# Patient Record
Sex: Male | Born: 1975 | Race: Black or African American | Hispanic: No | Marital: Single | Smoking: Current every day smoker
Health system: Southern US, Community
[De-identification: ages and names within clinical notes are randomized; demographics above are authoritative.]

## PROBLEM LIST (undated history)

## (undated) DIAGNOSIS — I10 Essential (primary) hypertension: Secondary | ICD-10-CM

---

## 2004-10-01 ENCOUNTER — Encounter (HOSPITAL_COMMUNITY): Admission: RE | Admit: 2004-10-01 | Discharge: 2004-10-31 | Payer: Self-pay | Admitting: Preventative Medicine

## 2007-01-01 ENCOUNTER — Emergency Department (HOSPITAL_COMMUNITY): Admission: EM | Admit: 2007-01-01 | Discharge: 2007-01-02 | Payer: Self-pay | Admitting: *Deleted

## 2007-09-11 ENCOUNTER — Emergency Department (HOSPITAL_COMMUNITY): Admission: EM | Admit: 2007-09-11 | Discharge: 2007-09-11 | Payer: Self-pay | Admitting: Emergency Medicine

## 2009-04-24 ENCOUNTER — Emergency Department (HOSPITAL_COMMUNITY): Admission: EM | Admit: 2009-04-24 | Discharge: 2009-04-25 | Payer: Self-pay | Admitting: Emergency Medicine

## 2009-05-18 ENCOUNTER — Emergency Department (HOSPITAL_COMMUNITY): Admission: EM | Admit: 2009-05-18 | Discharge: 2009-05-18 | Payer: Self-pay | Admitting: Emergency Medicine

## 2011-02-20 ENCOUNTER — Emergency Department (HOSPITAL_COMMUNITY)
Admission: EM | Admit: 2011-02-20 | Discharge: 2011-02-20 | Disposition: A | Discharge status: Home / Self Care | Payer: 59 | Attending: Emergency Medicine | Admitting: Emergency Medicine

## 2011-02-20 DIAGNOSIS — N489 Disorder of penis, unspecified: Secondary | ICD-10-CM | POA: Insufficient documentation

## 2011-02-20 DIAGNOSIS — I10 Essential (primary) hypertension: Secondary | ICD-10-CM | POA: Insufficient documentation

## 2011-02-20 LAB — URINALYSIS, ROUTINE W REFLEX MICROSCOPIC
Glucose, UA: NEGATIVE mg/dL
Hgb urine dipstick: NEGATIVE
Ketones, ur: NEGATIVE mg/dL
Protein, ur: NEGATIVE mg/dL
pH: 5.5 (ref 5.0–8.0)

## 2011-02-21 LAB — GC/CHLAMYDIA PROBE AMP, GENITAL: GC Probe Amp, Genital: NEGATIVE

## 2011-03-25 LAB — URINALYSIS, ROUTINE W REFLEX MICROSCOPIC
Glucose, UA: NEGATIVE mg/dL
Protein, ur: NEGATIVE mg/dL
Specific Gravity, Urine: 1.02 (ref 1.005–1.030)
Urobilinogen, UA: 0.2 mg/dL (ref 0.0–1.0)

## 2011-03-25 LAB — URINE MICROSCOPIC-ADD ON

## 2011-06-27 ENCOUNTER — Emergency Department (HOSPITAL_COMMUNITY)
Admission: EM | Admit: 2011-06-27 | Discharge: 2011-06-27 | Disposition: A | Discharge status: Home / Self Care | Payer: 59 | Attending: Emergency Medicine | Admitting: Emergency Medicine

## 2011-06-27 ENCOUNTER — Emergency Department (HOSPITAL_COMMUNITY): Payer: 59

## 2011-06-27 ENCOUNTER — Encounter: Payer: Self-pay | Admitting: *Deleted

## 2011-06-27 DIAGNOSIS — W19XXXA Unspecified fall, initial encounter: Secondary | ICD-10-CM | POA: Insufficient documentation

## 2011-06-27 DIAGNOSIS — S335XXA Sprain of ligaments of lumbar spine, initial encounter: Secondary | ICD-10-CM | POA: Insufficient documentation

## 2011-06-27 DIAGNOSIS — S42401A Unspecified fracture of lower end of right humerus, initial encounter for closed fracture: Secondary | ICD-10-CM

## 2011-06-27 DIAGNOSIS — I10 Essential (primary) hypertension: Secondary | ICD-10-CM | POA: Insufficient documentation

## 2011-06-27 DIAGNOSIS — S42409A Unspecified fracture of lower end of unspecified humerus, initial encounter for closed fracture: Secondary | ICD-10-CM | POA: Insufficient documentation

## 2011-06-27 DIAGNOSIS — F17200 Nicotine dependence, unspecified, uncomplicated: Secondary | ICD-10-CM | POA: Insufficient documentation

## 2011-06-27 HISTORY — DX: Essential (primary) hypertension: I10

## 2011-06-27 MED ORDER — METHOCARBAMOL 500 MG PO TABS: ORAL_TABLET | ORAL | Status: DC

## 2011-06-27 MED ORDER — HYDROCODONE-ACETAMINOPHEN 5-500 MG PO TABS: 1.0000 | ORAL_TABLET | Freq: Four times a day (QID) | ORAL | Status: AC | PRN

## 2011-06-27 NOTE — ED Provider Notes (Signed)
History     Chief Complaint  Patient presents with  . Elbow Pain    pt fell hitting his right elbow yesterday.   Patient is a 35 y.o. male presenting with arm injury. The history is provided by the patient.  Arm Injury  The incident occurred yesterday. The injury mechanism was a fall. The wounds were not self-inflicted. He came to the ER via personal transport. There is an injury to the right elbow. The pain is moderate. It is unlikely that a foreign body is present. Pertinent negatives include no chest pain, no abdominal pain, no neck pain, no seizures and no cough. Associated symptoms comments: Low back pain. There have been no prior injuries to these areas. He is right-handed. He has been behaving normally.    Past Medical History  Diagnosis Date  . Hypertension     History reviewed. No pertinent past surgical history.  Family History  Problem Relation Age of Onset  . Hypertension Mother     History  Substance Use Topics  . Smoking status: Current Everyday Smoker  . Smokeless tobacco: Not on file  . Alcohol Use: No      Review of Systems  Constitutional: Negative for activity change.       All ROS Neg except as noted in HPI  HENT: Negative for nosebleeds and neck pain.   Eyes: Negative for photophobia and discharge.  Respiratory: Negative for cough, shortness of breath and wheezing.   Cardiovascular: Negative for chest pain and palpitations.  Gastrointestinal: Negative for abdominal pain and blood in stool.  Genitourinary: Negative for dysuria, frequency and hematuria.  Musculoskeletal: Positive for back pain and arthralgias.  Skin: Negative.   Neurological: Negative for dizziness, seizures and speech difficulty.  Psychiatric/Behavioral: Negative for hallucinations and confusion.    Physical Exam  BP 155/94  Pulse 65  Temp(Src) 98.4 F (36.9 C) (Oral)  Resp 20  Ht 6\' 3"  (1.905 m)  Wt 202 lb (91.627 kg)  BMI 25.25 kg/m2  SpO2 100%  Physical Exam  Nursing  note and vitals reviewed. Constitutional: He is oriented to person, place, and time. He appears well-developed and well-nourished.  Non-toxic appearance.  HENT:  Head: Normocephalic.  Right Ear: Tympanic membrane and external ear normal.  Left Ear: Tympanic membrane and external ear normal.  Eyes: EOM and lids are normal. Pupils are equal, round, and reactive to light.  Neck: Normal range of motion. Neck supple. Carotid bruit is not present.  Cardiovascular: Normal rate, regular rhythm, normal heart sounds, intact distal pulses and normal pulses.   Pulmonary/Chest: Breath sounds normal. No respiratory distress.  Abdominal: Soft. Bowel sounds are normal. There is no tenderness. There is no guarding.  Musculoskeletal:       Right shoulder: He exhibits decreased range of motion and tenderness. He exhibits no deformity.       Right elbow: He exhibits decreased range of motion. He exhibits no effusion and no deformity. no tenderness found.       Lumbar back: He exhibits pain and spasm.  Lymphadenopathy:       Head (right side): No submandibular adenopathy present.       Head (left side): No submandibular adenopathy present.    He has no cervical adenopathy.  Neurological: He is alert and oriented to person, place, and time. He has normal strength. No cranial nerve deficit or sensory deficit.  Skin: Skin is warm and dry.  Psychiatric: He has a normal mood and affect. His speech is normal.  ED Course  Procedures  MDM I have reviewed nursing notes, vital signs, and all appropriate lab and imaging results for this patient.      Kathie Dike, [place] 06/27/11 Paulo Fruit

## 2011-08-29 NOTE — ED Provider Notes (Signed)
Medical screening examination/treatment/procedure(s) were performed by non-physician practitioner and as supervising physician I was immediately available for consultation/collaboration.  Nicholes Stairs, MD 08/29/11 [phone]

## 2011-09-25 LAB — STREP A DNA PROBE: Group A Strep Probe: NEGATIVE

## 2011-09-25 LAB — RAPID STREP SCREEN (MED CTR MEBANE ONLY): Streptococcus, Group A Screen (Direct): NEGATIVE

## 2012-05-30 IMAGING — CR DG ELBOW COMPLETE 3+V*R*
4 series · 4 of 4 positions shown · non-contrast
Comparison: None.

CLINICAL DATA: Posterior right elbow pain following a fall 2 days
ago.

RIGHT ELBOW - COMPLETE 3+ VIEW

[view not recorded (1 of 4)]
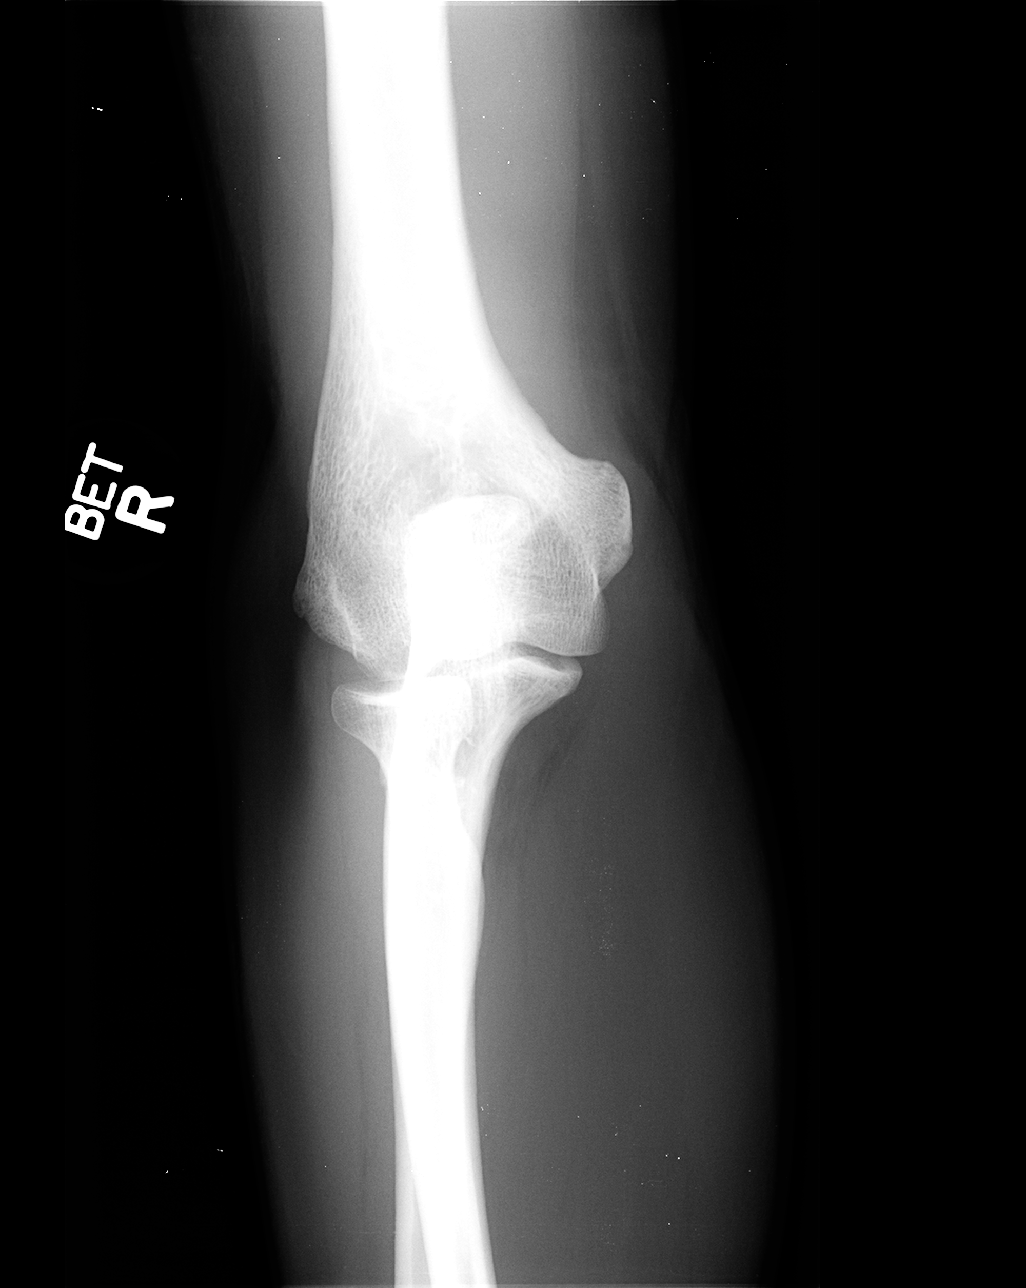

[view not recorded (2 of 4)]
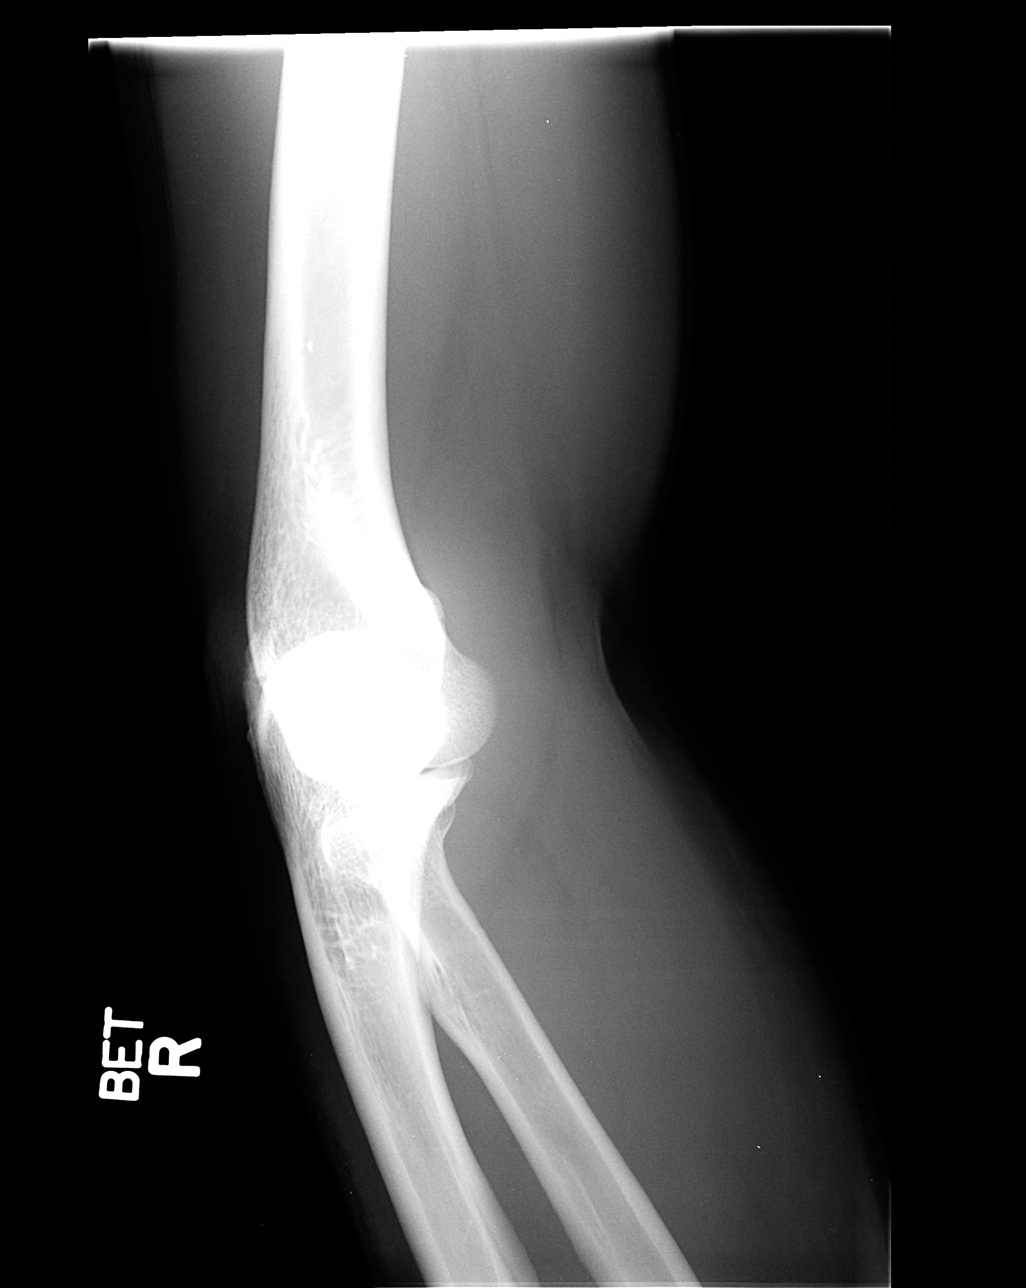

[view not recorded (3 of 4)]
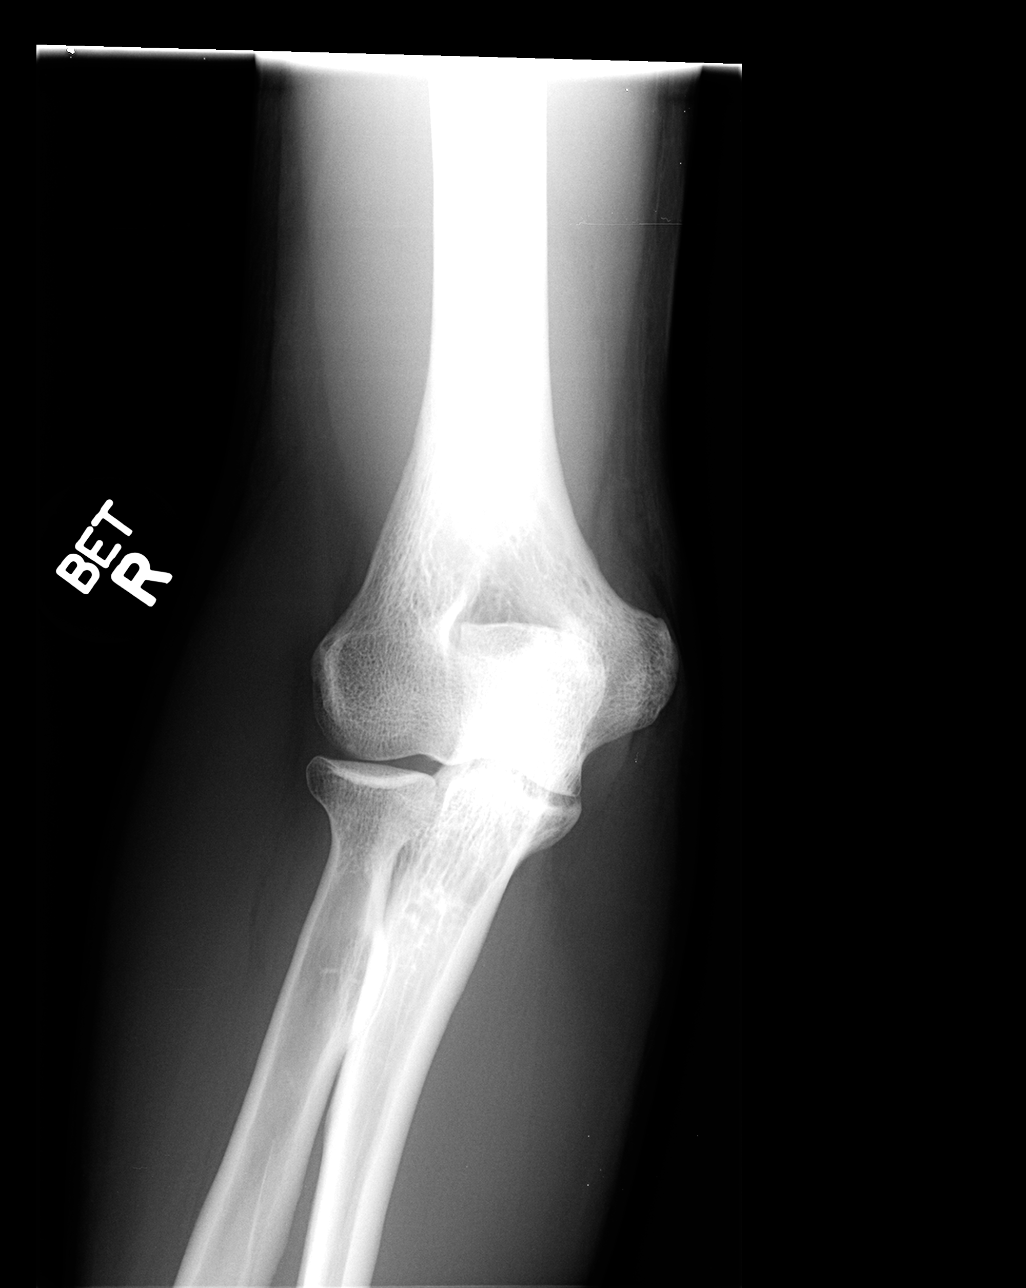

[view not recorded (4 of 4)]
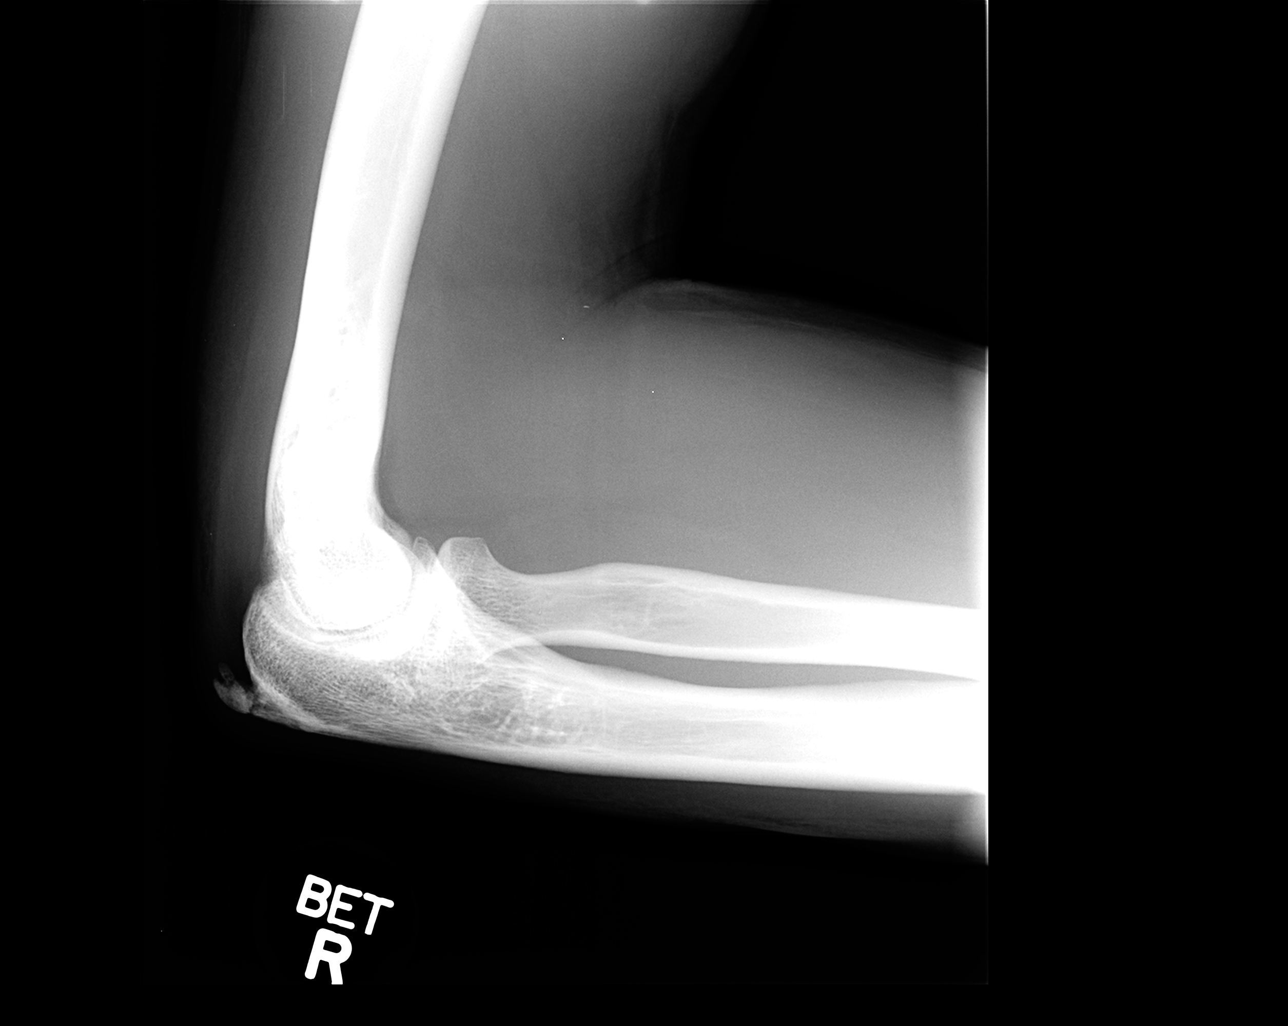

[4 of 4 positions shown; findings below may reference images not displayed]

FINDINGS: Large, fragmented olecranon spur at the site of triceps
tendon insertion.  Other than the fragmented spur, no fracture is
seen.  No dislocation or effusion demonstrated.
IMPRESSION: Large fragmented olecranon spur.

## 2012-06-11 ENCOUNTER — Emergency Department (HOSPITAL_COMMUNITY)
Admission: EM | Admit: 2012-06-11 | Discharge: 2012-06-11 | Disposition: A | Discharge status: Home / Self Care | Payer: No Typology Code available for payment source | Attending: Emergency Medicine | Admitting: Emergency Medicine

## 2012-06-11 ENCOUNTER — Encounter (HOSPITAL_COMMUNITY): Payer: Self-pay | Admitting: *Deleted

## 2012-06-11 ENCOUNTER — Emergency Department (HOSPITAL_COMMUNITY): Payer: No Typology Code available for payment source

## 2012-06-11 DIAGNOSIS — S39012A Strain of muscle, fascia and tendon of lower back, initial encounter: Secondary | ICD-10-CM

## 2012-06-11 DIAGNOSIS — S8002XA Contusion of left knee, initial encounter: Secondary | ICD-10-CM

## 2012-06-11 DIAGNOSIS — Y92410 Unspecified street and highway as the place of occurrence of the external cause: Secondary | ICD-10-CM | POA: Insufficient documentation

## 2012-06-11 DIAGNOSIS — M542 Cervicalgia: Secondary | ICD-10-CM | POA: Insufficient documentation

## 2012-06-11 DIAGNOSIS — R079 Chest pain, unspecified: Secondary | ICD-10-CM | POA: Insufficient documentation

## 2012-06-11 DIAGNOSIS — F17200 Nicotine dependence, unspecified, uncomplicated: Secondary | ICD-10-CM | POA: Insufficient documentation

## 2012-06-11 DIAGNOSIS — M25569 Pain in unspecified knee: Secondary | ICD-10-CM | POA: Insufficient documentation

## 2012-06-11 DIAGNOSIS — S161XXA Strain of muscle, fascia and tendon at neck level, initial encounter: Secondary | ICD-10-CM

## 2012-06-11 MED ORDER — IBUPROFEN 800 MG PO TABS
800.0000 mg | ORAL_TABLET | Freq: Once | ORAL | Status: AC
  Administered 2012-06-11: 800 mg via ORAL

## 2012-06-11 MED ORDER — IBUPROFEN 800 MG PO TABS
ORAL_TABLET | ORAL | Status: AC
  Filled 2012-06-11: qty 1

## 2012-06-11 MED ORDER — CYCLOBENZAPRINE HCL 10 MG PO TABS: ORAL_TABLET | ORAL | Status: DC

## 2012-06-11 MED ORDER — CYCLOBENZAPRINE HCL 10 MG PO TABS
10.0000 mg | ORAL_TABLET | Freq: Once | ORAL | Status: AC
  Administered 2012-06-11: 10 mg via ORAL

## 2012-06-11 MED ORDER — CYCLOBENZAPRINE HCL 10 MG PO TABS
ORAL_TABLET | ORAL | Status: AC
  Filled 2012-06-11: qty 1
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
## 2012-06-11 NOTE — ED Notes (Signed)
Patient now c/o soreness midsternally.  Pain increases w/deep inspiration and w/palpation.  No crepitus palpated. Breath sounds are clear bilaterally w/out rub. Strong S1-S2 w/out gallop or rub noted.  Trachea midline.

## 2012-06-11 NOTE — Discharge Instructions (Signed)
Contusion Contusion A contusion is a deep bruise. Contusions are the result of an injury that caused bleeding under the skin. The contusion may turn blue, purple, or yellow. Minor injuries will give you a painless contusion, but more severe contusions may stay painful and swollen for a few weeks.  CAUSES  A contusion is usually caused by a blow, trauma, or direct force to an area of the body. SYMPTOMS   Swelling and redness of the injured area.   Bruising of the injured area.   Tenderness and soreness of the injured area.   Pain.  DIAGNOSIS  The diagnosis can be made by taking a history and physical exam. An X-ray, CT scan, or MRI may be needed to determine if there were any associated injuries, such as fractures. TREATMENT  Specific treatment will depend on what area of the body was injured. In general, the best treatment for a contusion is resting, icing, elevating, and applying cold compresses to the injured area. Over-the-counter medicines may also be recommended for pain control. Ask your caregiver what the best treatment is for your contusion. HOME CARE INSTRUCTIONS   Put ice on the injured area.   Put ice in a plastic bag.   Place a towel between your skin and the bag.   Leave the ice on for 15 to 20 minutes, 3 to 4 times a day.   Only take over-the-counter or prescription medicines for pain, discomfort, or fever as directed by your caregiver. Your caregiver may recommend avoiding anti-inflammatory medicines (aspirin, ibuprofen, and naproxen) for 48 hours because these medicines may increase bruising.   Rest the injured area.   If possible, elevate the injured area to reduce swelling.  SEEK IMMEDIATE MEDICAL CARE IF:   You have increased bruising or swelling.   You have pain that is getting worse.   Your swelling or pain is not relieved with medicines.  MAKE SURE YOU:   Understand these instructions.   Will watch your condition.   Will get help right away if you  are not doing well or get worse.  Document Released: 09/10/2005 Document Revised: 11/20/2011 Document Reviewed: 10/06/2011 [facility] Patient Information 2012 [place], [place].Muscle Strain A muscle strain, or pulled muscle, occurs when a muscle is over-stretched. A small number of muscle fibers may also be torn. This is especially common in athletes. This happens when a sudden violent force placed on a muscle pushes it past its capacity. Usually, recovery from a pulled muscle takes 1 to 2 weeks. But complete healing will take 5 to 6 weeks. There are millions of muscle fibers. Following injury, your body will usually return to normal quickly. HOME CARE INSTRUCTIONS   While awake, apply ice to the sore muscle for 15 to 20 minutes each hour for the first 2 days. Put ice in a plastic bag and place a towel between the bag of ice and your skin.   Do not use the pulled muscle for several days. Do not use the muscle if you have pain.   You may wrap the injured area with an elastic bandage for comfort. Be careful not to bind it too tightly. This may interfere with blood circulation.   Only take over-the-counter or prescription medicines for pain, discomfort, or fever as directed by your caregiver. Do not use aspirin as this will increase bleeding (bruising) at injury site.   Warming up before exercise helps prevent muscle strains.  SEEK MEDICAL CARE IF:  There is increased pain or swelling in the  affected area. MAKE SURE YOU:   Understand these instructions.   Will watch your condition.   Will get help right away if you are not doing well or get worse.  Document Released: 12/01/2005 Document Revised: 11/20/2011 Document Reviewed: 06/30/2007 [facility] Patient Information 2012 [place], [place].   Take the flexeril  As directed.  .  Take ibuprofen 800 mg every 8 hrs with food.  Apply ice to areas of soreness several times daily.  Follow up with dr. Romeo Apple if not improving over the next week.

## 2012-06-11 NOTE — ED Provider Notes (Signed)
Medical screening examination/treatment/procedure(s) were performed by non-physician practitioner and as supervising physician I was immediately available for consultation/collaboration.   Celise Bazar L Niambi Smoak, MD 06/11/12 2252 
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
## 2014-07-04 ENCOUNTER — Emergency Department (HOSPITAL_COMMUNITY)
Admission: EM | Admit: 2014-07-04 | Discharge: 2014-07-04 | Disposition: A | Discharge status: Home / Self Care | Payer: 59 | Attending: Emergency Medicine | Admitting: Emergency Medicine

## 2014-07-04 ENCOUNTER — Encounter (HOSPITAL_COMMUNITY): Payer: Self-pay | Admitting: Emergency Medicine

## 2014-07-04 DIAGNOSIS — L258 Unspecified contact dermatitis due to other agents: Secondary | ICD-10-CM | POA: Insufficient documentation

## 2014-07-04 DIAGNOSIS — Z791 Long term (current) use of non-steroidal anti-inflammatories (NSAID): Secondary | ICD-10-CM | POA: Insufficient documentation

## 2014-07-04 DIAGNOSIS — I10 Essential (primary) hypertension: Secondary | ICD-10-CM | POA: Insufficient documentation

## 2014-07-04 DIAGNOSIS — F17200 Nicotine dependence, unspecified, uncomplicated: Secondary | ICD-10-CM | POA: Insufficient documentation

## 2014-07-04 DIAGNOSIS — L259 Unspecified contact dermatitis, unspecified cause: Secondary | ICD-10-CM

## 2014-07-04 MED ORDER — PREDNISONE 10 MG PO TABS: ORAL_TABLET | ORAL | Status: DC

## 2014-07-04 NOTE — Discharge Instructions (Signed)

## 2014-07-04 NOTE — ED Provider Notes (Signed)
CSN: [id number]     Arrival date & time 07/04/14  1154 History   First MD Initiated Contact with Patient 07/04/14 1235     Chief Complaint  Patient presents with  . Ankle Pain     (Consider location/radiation/quality/duration/timing/severity/associated sxs/prior Treatment) HPI Comments: Pt states that he has had a rash to bilateral lower and upper extremities and trunk. Denies fever. States that he has used new detergent and he has been working in the yard. Pt states that he has noticed some swelling to the left ankle but it is not hot. He hasn't taken anything for symptoms  The history is provided by the patient. No language interpreter was used.    Past Medical History  Diagnosis Date  . Hypertension    History reviewed. No pertinent past surgical history. Family History  Problem Relation Age of Onset  . Hypertension Mother    History  Substance Use Topics  . Smoking status: Current Every Day Smoker  . Smokeless tobacco: Not on file  . Alcohol Use: No    Review of Systems  Constitutional: Negative.   Respiratory: Negative.   Cardiovascular: Negative.       Allergies  Review of patient's allergies indicates no known allergies.  Home Medications   Prior to Admission medications   Medication Sig Start Date End Date Taking? Authorizing Provider  ibuprofen (ADVIL,MOTRIN) 200 MG tablet Take 600 mg by mouth every 6 (six) hours as needed for moderate pain.   Yes Historical Provider, MD  predniSONE (DELTASONE) 10 MG tablet 6 day step down dose 07/04/14   Teressa LowerVrinda Carlia Bomkamp, NP   BP 141/96  Pulse 76  Temp(Src) 98.6 F (37 C)  Resp 16  SpO2 99% Physical Exam  Nursing note and vitals reviewed. Constitutional: He is oriented to person, place, and time. He appears well-developed and well-nourished.  HENT:  Head: Normocephalic and atraumatic.  Mouth/Throat: Oropharynx is clear and moist.  Cardiovascular: Normal rate and regular rhythm.   Pulmonary/Chest: Effort normal  and breath sounds normal.  Musculoskeletal: Normal range of motion.  Full rom on left ankle. No redness or warmth noted  Neurological: He is alert and oriented to person, place, and time. Coordination normal.  Skin:  Papular rash noted on bilaterally lower and upper extremities with most diffuse are to the left ankle:few spots noted on the trunk    ED Course  Procedures (including critical care time) Labs Review Labs Reviewed - No data to display  Imaging Review No results found.   EKG Interpretation None      MDM   Final diagnoses:  Contact dermatitis    Appear to be a contact dermatitis either to detergent or poison ivy/oak. No oral involvement. Will treat with steroid dose pack    Teressa LowerVrinda Darlis Wragg, NP 07/04/14 1311

## 2014-07-04 NOTE — ED Notes (Signed)
Swelling to left ankle and rash to left knee.

## 2014-07-05 NOTE — ED Provider Notes (Signed)
Medical screening examination/treatment/procedure(s) were performed by non-physician practitioner and as supervising physician I was immediately available for consultation/collaboration.   EKG Interpretation None        Curtina Grills, MD 07/05/14 0737 

## 2014-10-06 ENCOUNTER — Emergency Department (HOSPITAL_COMMUNITY): Payer: No Typology Code available for payment source

## 2014-10-06 ENCOUNTER — Emergency Department (HOSPITAL_COMMUNITY)
Admission: EM | Admit: 2014-10-06 | Discharge: 2014-10-06 | Disposition: A | Discharge status: Home / Self Care | Payer: No Typology Code available for payment source | Attending: Emergency Medicine | Admitting: Emergency Medicine

## 2014-10-06 ENCOUNTER — Encounter (HOSPITAL_COMMUNITY): Payer: Self-pay | Admitting: Emergency Medicine

## 2014-10-06 DIAGNOSIS — S8392XA Sprain of unspecified site of left knee, initial encounter: Secondary | ICD-10-CM | POA: Insufficient documentation

## 2014-10-06 DIAGNOSIS — I10 Essential (primary) hypertension: Secondary | ICD-10-CM | POA: Diagnosis not present

## 2014-10-06 DIAGNOSIS — Z720 Tobacco use: Secondary | ICD-10-CM | POA: Insufficient documentation

## 2014-10-06 DIAGNOSIS — Z7952 Long term (current) use of systemic steroids: Secondary | ICD-10-CM | POA: Diagnosis not present

## 2014-10-06 DIAGNOSIS — M25561 Pain in right knee: Secondary | ICD-10-CM | POA: Diagnosis present

## 2014-10-06 DIAGNOSIS — T1490XA Injury, unspecified, initial encounter: Secondary | ICD-10-CM

## 2014-10-06 DIAGNOSIS — Y92410 Unspecified street and highway as the place of occurrence of the external cause: Secondary | ICD-10-CM | POA: Insufficient documentation

## 2014-10-06 DIAGNOSIS — Y9389 Activity, other specified: Secondary | ICD-10-CM | POA: Diagnosis not present

## 2014-10-06 DIAGNOSIS — Z79899 Other long term (current) drug therapy: Secondary | ICD-10-CM | POA: Insufficient documentation

## 2014-10-06 MED ORDER — NAPROXEN 500 MG PO TABS: 500.0000 mg | ORAL_TABLET | Freq: Two times a day (BID) | ORAL | Status: DC

## 2014-10-06 MED ORDER — HYDROCODONE-ACETAMINOPHEN 5-325 MG PO TABS: ORAL_TABLET | ORAL | Status: DC

## 2014-10-06 NOTE — ED Notes (Signed)
Pain lt knee, ambulatory ,but painful with wt bearing and movement.

## 2014-10-06 NOTE — ED Notes (Signed)
Patient given discharge instruction, verbalized understand. IV removed, band aid applied. Patient ambulatory out of the department.  

## 2014-10-06 NOTE — Discharge Instructions (Signed)
Knee Pain °Knee pain can be a result of an injury or other medical conditions. Treatment will depend on the cause of your pain. °HOME CARE °· Only take medicine as told by your doctor. °· Keep a healthy weight. Being overweight can make the knee hurt more. °· Stretch before exercising or playing sports. °· If there is constant knee pain, change the way you exercise. Ask your doctor for advice. °· Make sure shoes fit well. Choose the right shoe for the sport or activity. °· Protect your knees. Wear kneepads if needed. °· Rest when you are tired. °GET HELP RIGHT AWAY IF:  °· Your knee pain does not stop. °· Your knee pain does not get better. °· Your knee joint feels hot to the touch. °· You have a fever. °MAKE SURE YOU:  °· Understand these instructions. °· Will watch this condition. °· Will get help right away if you are not doing well or get worse. °Document Released: 02/27/2009 Document Revised: 02/23/2012 Document Reviewed: 02/27/2009 °[employer]® Patient Information ©2015 [employer]. This information is not intended to replace advice given to you by your health care provider. Make sure you discuss any questions you have with your health care provider. ° °
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
## 2014-10-10 NOTE — ED Provider Notes (Signed)
Medical screening examination/treatment/procedure(s) were performed by non-physician practitioner and as supervising physician I was immediately available for consultation/collaboration.   EKG Interpretation None        Hermina Barnard L Faustine Tates, MD 10/10/14 0749 

## 2015-02-18 ENCOUNTER — Encounter (HOSPITAL_COMMUNITY): Payer: Self-pay | Admitting: Emergency Medicine

## 2015-02-18 ENCOUNTER — Emergency Department (HOSPITAL_COMMUNITY)
Admission: EM | Admit: 2015-02-18 | Discharge: 2015-02-18 | Disposition: A | Discharge status: Home / Self Care | Payer: 59 | Attending: Emergency Medicine | Admitting: Emergency Medicine

## 2015-02-18 ENCOUNTER — Emergency Department (HOSPITAL_COMMUNITY): Payer: 59

## 2015-02-18 DIAGNOSIS — Z7952 Long term (current) use of systemic steroids: Secondary | ICD-10-CM | POA: Insufficient documentation

## 2015-02-18 DIAGNOSIS — Z791 Long term (current) use of non-steroidal anti-inflammatories (NSAID): Secondary | ICD-10-CM | POA: Insufficient documentation

## 2015-02-18 DIAGNOSIS — Z720 Tobacco use: Secondary | ICD-10-CM | POA: Insufficient documentation

## 2015-02-18 DIAGNOSIS — B349 Viral infection, unspecified: Secondary | ICD-10-CM

## 2015-02-18 DIAGNOSIS — M791 Myalgia: Secondary | ICD-10-CM | POA: Insufficient documentation

## 2015-02-18 DIAGNOSIS — I10 Essential (primary) hypertension: Secondary | ICD-10-CM | POA: Insufficient documentation

## 2015-02-18 MED ORDER — IBUPROFEN 800 MG PO TABS: 800.0000 mg | ORAL_TABLET | Freq: Three times a day (TID) | ORAL | Status: DC

## 2015-02-18 MED ORDER — HYDROCOD POLST-CHLORPHEN POLST 10-8 MG/5ML PO LQCR: 5.0000 mL | Freq: Two times a day (BID) | ORAL | Status: DC

## 2015-02-18 MED ORDER — HYDROCOD POLST-CHLORPHEN POLST 10-8 MG/5ML PO LQCR
5.0000 mL | Freq: Once | ORAL | Status: AC
  Administered 2015-02-18: 5 mL via ORAL
  Filled 2015-02-18: qty 5

## 2015-02-18 MED ORDER — ACETAMINOPHEN 500 MG PO TABS
1000.0000 mg | ORAL_TABLET | Freq: Once | ORAL | Status: AC
  Administered 2015-02-18: 1000 mg via ORAL
  Filled 2015-02-18: qty 2

## 2015-02-18 MED ORDER — IBUPROFEN 800 MG PO TABS
ORAL_TABLET | ORAL | Status: AC
  Filled 2015-02-18: qty 1

## 2015-02-18 MED ORDER — IBUPROFEN 800 MG PO TABS
800.0000 mg | ORAL_TABLET | Freq: Once | ORAL | Status: AC
  Administered 2015-02-18: 800 mg via ORAL

## 2015-02-18 MED ORDER — OSELTAMIVIR PHOSPHATE 75 MG PO CAPS: 75.0000 mg | ORAL_CAPSULE | Freq: Two times a day (BID) | ORAL | Status: DC

## 2015-02-18 NOTE — ED Notes (Signed)
PT c/o fever, generalized body aches, cold chills, headache and productive cough with clear phlegm x2 days. PT denies any OTC medications today.

## 2015-02-18 NOTE — ED Provider Notes (Signed)
CSN: [id number]     Arrival date & time 02/18/15  1625 History  This chart was scribed for Pauline Ausammy Flynt Breeze, PA, working with Benny LennertJoseph L Zammit, MD by Elon SpannerGarrett Cook, ED Scribe. This patient was seen in room APFT24/APFT24 and the patient's care was started at 6:115 PM.   Chief Complaint  Patient presents with  . Fever   The history is provided by the patient. No language interpreter was used.   HPI Comments: Charles Wong is a 39 y.o. male who presents to the Emergency Department complaining of a fever with associated cough productive of white phlegm, sore throat, throbbing temporal headache, nasal congestion, and achy leg/back pain onset two days ago.  Symptoms were sudden in onset.  He reports his symptoms began with a sore throat.  He has taken Aleve without relief.  Patient did not have a flu shot this year because in the past they have made him feel ill.  Patient denies known recent sick contacts  Patient denies history of DM.  Patient also denies CP, SOB, vomiting, diarrhea, rash, neck pain or stiffness or abdominal pain.   Past Medical History  Diagnosis Date  . Hypertension    History reviewed. No pertinent past surgical history. Family History  Problem Relation Age of Onset  . Hypertension Mother    History  Substance Use Topics  . Smoking status: Current Every Day Smoker -- 0.50 packs/day    Types: Cigarettes  . Smokeless tobacco: Not on file  . Alcohol Use: No    Review of Systems  Constitutional: Positive for fever, chills and fatigue. Negative for appetite change.  HENT: Positive for congestion and sore throat. Negative for trouble swallowing.   Eyes: Negative for visual disturbance.  Respiratory: Positive for cough. Negative for shortness of breath and wheezing.   Cardiovascular: Negative for chest pain.  Gastrointestinal: Negative for vomiting, abdominal pain and diarrhea.  Genitourinary: Negative for dysuria.  Musculoskeletal: Positive for myalgias.  Skin: Negative  for rash.  Neurological: Positive for headaches. Negative for dizziness, syncope, weakness and numbness.  All other systems reviewed and are negative.     Allergies  Review of patient's allergies indicates no known allergies.  Home Medications   Prior to Admission medications   Medication Sig Start Date End Date Taking? Authorizing Provider  HYDROcodone-acetaminophen (NORCO/VICODIN) 5-325 MG per tablet Take one-two tabs po q 4-6 hrs prn pain 10/06/14   Jovahn Breit L. Anslee Micheletti, PA-C  ibuprofen (ADVIL,MOTRIN) 200 MG tablet Take 600 mg by mouth every 6 (six) hours as needed for moderate pain.    Historical Provider, MD  naproxen (NAPROSYN) 500 MG tablet Take 1 tablet (500 mg total) by mouth 2 (two) times daily. 10/06/14   Remedios Mckone L. Filimon Miranda, PA-C  predniSONE (DELTASONE) 10 MG tablet 6 day step down dose 07/04/14   Teressa LowerVrinda Pickering, NP   BP 165/96 mmHg  Pulse 96  Temp(Src) 101.3 F (38.5 C) (Oral)  Resp 18  Ht 6\' 4"  (1.93 m)  Wt 220 lb (99.791 kg)  BMI 26.79 kg/m2  SpO2 100% Physical Exam  Constitutional: He is oriented to person, place, and time. He appears well-developed and well-nourished. No distress.  HENT:  Head: Normocephalic and atraumatic.  Right Ear: Tympanic membrane normal.  Left Ear: Tympanic membrane normal.  Erythema of oropharynx.  Uvula midline, no edema, no exudates.    Eyes: Conjunctivae and EOM are normal.  Neck: Normal range of motion, full passive range of motion without pain and phonation normal. Neck supple. No  tracheal deviation present. No Kernig's sign noted.  Cardiovascular: Normal rate, regular rhythm, normal heart sounds and intact distal pulses.   No murmur heard. Pulmonary/Chest: Effort normal and breath sounds normal. No respiratory distress. He has no wheezes. He has no rales.  Abdominal: Soft. He exhibits no distension. There is no tenderness. There is no rebound and no guarding.  Musculoskeletal: Normal range of motion.  Lymphadenopathy:    He has no  cervical adenopathy.  Neurological: He is alert and oriented to person, place, and time.  Skin: Skin is warm and dry.  Psychiatric: He has a normal mood and affect. His behavior is normal.  Nursing note and vitals reviewed.   ED Course  Procedures (including critical care time)  DIAGNOSTIC STUDIES: Oxygen Saturation is 100% on RA, normal by my interpretation.    COORDINATION OF CARE:  6:19 PM Discussed treatment plan with patient at bedside.  Patient acknowledges and agrees with plan.    Labs Review Labs Reviewed - No data to display  Imaging Review No results found.   EKG Interpretation None      MDM   Final diagnoses:  Viral illness    Pt is uncomfortable appearing, but non-toxic.  No meningeal signs.  Mucous membranes moist.  No acute abdomen.  Sx's c/w influenza.  Pt agrees to symptomatic tx.  Advised to increase fluids, ibuprofen and close PMD f/u.  Also advised pt to return here if sx's not improving.  I personally performed the services described in this documentation, which was scribed in my presence. The recorded information has been reviewed and is accurate.    Severiano Gilbert, PA-C 02/20/15 2336  Bethann Berkshire, MD 02/21/15 [phone]

## 2021-04-08 DIAGNOSIS — R519 Headache, unspecified: Secondary | ICD-10-CM | POA: Diagnosis not present

## 2021-05-03 DIAGNOSIS — M272 Inflammatory conditions of jaws: Secondary | ICD-10-CM | POA: Diagnosis not present

## 2022-02-03 ENCOUNTER — Encounter (HOSPITAL_BASED_OUTPATIENT_CLINIC_OR_DEPARTMENT_OTHER): Payer: Self-pay | Admitting: Family Medicine

## 2022-02-03 ENCOUNTER — Ambulatory Visit (INDEPENDENT_AMBULATORY_CARE_PROVIDER_SITE_OTHER): Payer: BC Managed Care – PPO | Admitting: Family Medicine

## 2022-02-03 ENCOUNTER — Other Ambulatory Visit: Payer: Self-pay

## 2022-02-03 DIAGNOSIS — M25511 Pain in right shoulder: Secondary | ICD-10-CM

## 2022-02-03 DIAGNOSIS — M542 Cervicalgia: Secondary | ICD-10-CM | POA: Diagnosis not present

## 2022-02-03 DIAGNOSIS — M25519 Pain in unspecified shoulder: Secondary | ICD-10-CM | POA: Insufficient documentation

## 2022-02-03 NOTE — Assessment & Plan Note (Signed)
See discussion as outlined above

## 2022-02-03 NOTE — Assessment & Plan Note (Signed)
Charles Wong is a 46 year old male presenting for evaluation of right shoulder pain and neck pain.  Pain has been present for about 1 month.  He recalls that at work he was moving a hot water heater when pain first started.  Pain is primarily over lateral shoulder, occasionally has neck pain along right side of neck.  Reports having some numbness extending from the shoulder down his right arm.  Patient is right-handed.  Feels that symptoms are mostly unchanged since they began.  Does not recall any alleviating factors.  Feels worse when he is sitting up. On exam, mild tenderness to palpation over lateral shoulder, just proximal to deltoid insertion over humerus.  Normal external rotation, slightly reduced internal rotation, slightly reduced abduction.  Normal strength testing.  Negative empty can, negative Hawkins, positive Neer's.  Negative Spurling. Possible injury to rotator cuff or deltoid, we will proceed with initial x-ray imaging.  Numbness raises concern for possible nerve impingement in cervical region, will obtain cervical spine x-rays as well Pending results of x-rays, consider evaluation with PT Also of consideration would be eventual EMG to assess nerve conduction Plan for follow-up as needed.  If imaging unremarkable, likely recommend physical therapy for further evaluation.  If not responding to PT, EMG would be next step

## 2022-02-03 NOTE — Patient Instructions (Signed)
°  Medication Instructions:  Your physician recommends that you continue on your current medications as directed. Please refer to the Current Medication list given to you today. --If you need a refill on any your medications before your next appointment, please call your pharmacy first. If no refills are authorized on file call the office.--   Referrals/Procedures/Imaging: Cervical Xray complete Right shoulder xray  Follow-Up: Your next appointment:   Your physician recommends that you schedule a follow-up appointment in: PRN with Dr. [name]  You will receive a text message or e-mail with a link to a survey about your care and experience with [place] today! We would greatly appreciate your feedback!   Thanks for letting us be apart of your health journey!!  Primary Care and Sports Medicine   Dr. Arlina Robes [place]   We encourage you to activate your patient portal called "MyChart".  Sign up information is provided on this After Visit Summary.  MyChart is used to connect with patients for Virtual Visits (Telemedicine).  Patients are able to view lab/test results, encounter notes, upcoming appointments, etc.  Non-urgent messages can be sent to your provider as well. To learn more about what you can do with MyChart, please visit --  [email or web address].

## 2022-02-03 NOTE — Progress Notes (Signed)
° ° °  Procedures performed today:    None.  Independent interpretation of notes and tests performed by another provider:   None.  Brief History, Exam, Impression, and Recommendations:    BP 128/82    Pulse 86    Ht 6\' 4"  (1.93 m)    Wt 228 lb (103.4 kg)    SpO2 97%    BMI 27.75 kg/m   Shoulder pain Charles Wong is a 46 year old male presenting for evaluation of right shoulder pain and neck pain.  Pain has been present for about 1 month.  He recalls that at work he was moving a hot water heater when pain first started.  Pain is primarily over lateral shoulder, occasionally has neck pain along right side of neck.  Reports having some numbness extending from the shoulder down his right arm.  Patient is right-handed.  Feels that symptoms are mostly unchanged since they began.  Does not recall any alleviating factors.  Feels worse when he is sitting up. On exam, mild tenderness to palpation over lateral shoulder, just proximal to deltoid insertion over humerus.  Normal external rotation, slightly reduced internal rotation, slightly reduced abduction.  Normal strength testing.  Negative empty can, negative Hawkins, positive Neer's.  Negative Spurling. Possible injury to rotator cuff or deltoid, we will proceed with initial x-ray imaging.  Numbness raises concern for possible nerve impingement in cervical region, will obtain cervical spine x-rays as well Pending results of x-rays, consider evaluation with PT Also of consideration would be eventual EMG to assess nerve conduction Plan for follow-up as needed.  If imaging unremarkable, likely recommend physical therapy for further evaluation.  If not responding to PT, EMG would be next step  Neck pain See discussion as outlined above   ___________________________________________ Charles Steptoe [name], MD, ABFM, CAQSM [facility] and [place]

## 2023-11-17 ENCOUNTER — Emergency Department (HOSPITAL_COMMUNITY): Payer: BC Managed Care – PPO

## 2023-11-17 ENCOUNTER — Other Ambulatory Visit: Payer: Self-pay

## 2023-11-17 ENCOUNTER — Encounter (HOSPITAL_COMMUNITY): Payer: Self-pay

## 2023-11-17 ENCOUNTER — Emergency Department (HOSPITAL_COMMUNITY)
Admission: EM | Admit: 2023-11-17 | Discharge: 2023-11-17 | Disposition: A | Discharge status: Home / Self Care | Payer: BC Managed Care – PPO | Attending: Emergency Medicine | Admitting: Emergency Medicine

## 2023-11-17 DIAGNOSIS — F17200 Nicotine dependence, unspecified, uncomplicated: Secondary | ICD-10-CM | POA: Diagnosis not present

## 2023-11-17 DIAGNOSIS — I10 Essential (primary) hypertension: Secondary | ICD-10-CM | POA: Insufficient documentation

## 2023-11-17 DIAGNOSIS — J9811 Atelectasis: Secondary | ICD-10-CM | POA: Diagnosis not present

## 2023-11-17 DIAGNOSIS — R0602 Shortness of breath: Secondary | ICD-10-CM

## 2023-11-17 DIAGNOSIS — R030 Elevated blood-pressure reading, without diagnosis of hypertension: Secondary | ICD-10-CM | POA: Diagnosis not present

## 2023-11-17 DIAGNOSIS — R059 Cough, unspecified: Secondary | ICD-10-CM | POA: Diagnosis not present

## 2023-11-17 DIAGNOSIS — Z1152 Encounter for screening for COVID-19: Secondary | ICD-10-CM | POA: Diagnosis not present

## 2023-11-17 DIAGNOSIS — J929 Pleural plaque without asbestos: Secondary | ICD-10-CM | POA: Diagnosis not present

## 2023-11-17 DIAGNOSIS — R0789 Other chest pain: Secondary | ICD-10-CM | POA: Diagnosis not present

## 2023-11-17 LAB — BASIC METABOLIC PANEL
Anion gap: 10 (ref 5–15)
BUN: 9 mg/dL (ref 6–20)
CO2: 26 mmol/L (ref 22–32)
Calcium: 9.8 mg/dL (ref 8.9–10.3)
Chloride: 104 mmol/L (ref 98–111)
Creatinine, Ser: 1.16 mg/dL (ref 0.61–1.24)
GFR, Estimated: >60 mL/min (ref >60)
Glucose, Bld: 129 mg/dL — ABNORMAL HIGH (ref 70–99)
Potassium: 3.7 mmol/L (ref 3.5–5.1)
Sodium: 140 mmol/L (ref 135–145)

## 2023-11-17 LAB — CBC WITH DIFFERENTIAL/PLATELET
Abs Immature Granulocytes: 0.01 10*3/uL (ref 0.00–0.07)
Basophils Absolute: 0.1 10*3/uL (ref 0.0–0.1)
Basophils Relative: 1 %
Eosinophils Absolute: 0.5 10*3/uL (ref 0.0–0.5)
Eosinophils Relative: 8 %
HCT: 43.4 % (ref 39.0–52.0)
Hemoglobin: 14.9 g/dL (ref 13.0–17.0)
Immature Granulocytes: 0 %
Lymphocytes Relative: 30 %
Lymphs Abs: 1.8 10*3/uL (ref 0.7–4.0)
MCH: 31.6 pg (ref 26.0–34.0)
MCHC: 34.3 g/dL (ref 30.0–36.0)
MCV: 92.1 fL (ref 80.0–100.0)
Monocytes Absolute: 0.9 10*3/uL (ref 0.1–1.0)
Monocytes Relative: 15 %
Neutro Abs: 2.7 10*3/uL (ref 1.7–7.7)
Neutrophils Relative %: 46 %
Platelets: 176 10*3/uL (ref 150–400)
RBC: 4.71 MIL/uL (ref 4.22–5.81)
RDW: 11.9 % (ref 11.5–15.5)
WBC: 6 10*3/uL (ref 4.0–10.5)
nRBC: 0 % (ref 0.0–0.2)

## 2023-11-17 LAB — BRAIN NATRIURETIC PEPTIDE: B Natriuretic Peptide: 13 pg/mL (ref 0.0–100.0)

## 2023-11-17 LAB — MAGNESIUM: Magnesium: 1.9 mg/dL (ref 1.7–2.4)

## 2023-11-17 LAB — HEPATIC FUNCTION PANEL
ALT: 28 U/L (ref 0–44)
AST: 21 U/L (ref 15–41)
Albumin: 4.3 g/dL (ref 3.5–5.0)
Alkaline Phosphatase: 75 U/L (ref 38–126)
Bilirubin, Direct: 0.1 mg/dL (ref 0.0–0.2)
Indirect Bilirubin: 0.4 mg/dL (ref 0.3–0.9)
Total Bilirubin: 0.5 mg/dL (ref <1.2)
Total Protein: 8 g/dL (ref 6.5–8.1)

## 2023-11-17 LAB — TROPONIN I (HIGH SENSITIVITY)
Troponin I (High Sensitivity): 8 ng/L (ref <18)
Troponin I (High Sensitivity): 8 ng/L (ref <18)

## 2023-11-17 LAB — RESP PANEL BY RT-PCR (RSV, FLU A&B, COVID)  RVPGX2
Influenza A by PCR: NEGATIVE
Influenza B by PCR: NEGATIVE
Resp Syncytial Virus by PCR: NEGATIVE
SARS Coronavirus 2 by RT PCR: NEGATIVE

## 2023-11-17 MED ORDER — HYDRALAZINE HCL 20 MG/ML IJ SOLN
5.0000 mg | Freq: Once | INTRAMUSCULAR | Status: AC
  Administered 2023-11-17: 5 mg via INTRAVENOUS
  Filled 2023-11-17: qty 1

## 2023-11-17 MED ORDER — AMLODIPINE BESYLATE 5 MG PO TABS: 5.0000 mg | ORAL_TABLET | Freq: Every day | ORAL | 2 refills | Status: DC

## 2023-11-17 MED ORDER — AMLODIPINE BESYLATE 5 MG PO TABS
5.0000 mg | ORAL_TABLET | Freq: Once | ORAL | Status: AC
  Administered 2023-11-17: 5 mg via ORAL
  Filled 2023-11-17: qty 1

## 2023-11-17 MED ORDER — IOHEXOL 350 MG/ML SOLN
100.0000 mL | Freq: Once | INTRAVENOUS | Status: AC | PRN
  Administered 2023-11-17: 100 mL via INTRAVENOUS

## 2023-11-17 MED ORDER — ACETAMINOPHEN 325 MG PO TABS
650.0000 mg | ORAL_TABLET | Freq: Once | ORAL | Status: DC
  Filled 2023-11-17 (×2): qty 2

## 2023-11-17 MED ORDER — NITROGLYCERIN 2 % TD OINT
1.0000 [in_us] | TOPICAL_OINTMENT | Freq: Once | TRANSDERMAL | Status: AC
  Administered 2023-11-17: 1 [in_us] via TOPICAL
  Filled 2023-11-17: qty 1

## 2023-11-17 NOTE — ED Triage Notes (Signed)
Pt complaining of chest tightness and shortness of breath that started about 30 min ago. York Spaniel he has congested and had a cough since yesterday. Denies any cardiac or respiratory problems.

## 2023-11-17 NOTE — ED Provider Notes (Signed)
  Physical Exam  BP (!) 165/107   Pulse 72   Temp 98.2 F (36.8 C)   Resp 17   Ht 6\' 4"  (1.93 m)   Wt 102.1 kg   SpO2 98%   BMI 27.39 kg/m   Physical Exam Constitutional:      General: He is not in acute distress.    Appearance: Normal appearance.  HENT:     Head: Normocephalic and atraumatic.     Nose: No congestion or rhinorrhea.  Eyes:     General:        Right eye: No discharge.        Left eye: No discharge.     Extraocular Movements: Extraocular movements intact.     Pupils: Pupils are equal, round, and reactive to light.  Cardiovascular:     Rate and Rhythm: Normal rate and regular rhythm.     Heart sounds: No murmur heard. Pulmonary:     Effort: No respiratory distress.     Breath sounds: No wheezing or rales.  Abdominal:     General: There is no distension.     Tenderness: There is no abdominal tenderness.  Musculoskeletal:        General: Normal range of motion.     Cervical back: Normal range of motion.  Skin:    General: Skin is warm and dry.  Neurological:     General: No focal deficit present.     Mental Status: He is alert.     Procedures  Procedures  ED Course / MDM    Medical Decision Making Amount and/or Complexity of Data Reviewed Labs: ordered. Radiology: ordered.  Risk OTC drugs. Prescription drug management.   Patient received in handoff.  Shortness of breath pending PE study.  Plan for discharge PE study negative.  PE study is reassuringly negative.  On reevaluation, patient remains asymptomatic with no complaints of chest pain or shortness of breath.  High-sensitivity troponins are normal and he and his partner were given strict return precautions of which they voiced understanding.  Patient discharged       Glendora Score, MD 11/17/23 1259

## 2023-11-17 NOTE — ED Notes (Signed)
Pt verbalized understanding of discharge instructions. Opportunity for questions provided.  

## 2023-11-17 NOTE — Discharge Instructions (Addendum)
A prescription for a blood pressure medication was sent to your pharmacy.  Take daily as prescribed.  Monitor your blood pressure at home and follow-up with a primary care doctor for further medication adjustments.  If you do not have a primary care doctor, there are telephone numbers below that you can call to set that up.  Return to the emergency department for any new or worsening symptoms of concern.

## 2023-11-17 NOTE — ED Provider Notes (Signed)
[place] EMERGENCY DEPARTMENT AT [facility] Provider Note   CSN: [id number] Arrival date & time: 11/17/23  2725     History  Chief Complaint  Patient presents with   Shortness of Breath    Charles Wong is a 47 y.o. male.  HPI Patient presents for shortness of breath.  Medical history includes HTN, tobacco use.  Yesterday, he developed cough and chest congestion.  This morning, he woke up from sleep.  He had further coughing.  He then developed acute shortness of breath.  This was just prior to arrival.  Shortness of breath has mildly subsided.  He continues to feel tight in his chest.  He has no known history of reactive airway disease or heart failure.  He denies any areas of pain.    Home Medications Prior to Admission medications   Medication Sig Start Date End Date Taking? Authorizing Provider  amLODipine (NORVASC) 5 MG tablet Take 1 tablet (5 mg total) by mouth daily. 11/17/23 02/15/24 Yes Gloris Manchester, MD  chlorpheniramine-HYDROcodone [facility] PENNKINETIC ER) 10-8 MG/5ML LQCR Take 5 mLs by mouth 2 (two) times daily. 02/18/15   Triplett, Tammy, PA-C  HYDROcodone-acetaminophen (NORCO/VICODIN) 5-325 MG per tablet Take one-two tabs po q 4-6 hrs prn pain Patient not taking: Reported on 02/18/2015 10/06/14   Triplett, Tammy, PA-C  ibuprofen (ADVIL,MOTRIN) 800 MG tablet Take 1 tablet (800 mg total) by mouth 3 (three) times daily. 02/18/15   Triplett, Tammy, PA-C  naproxen (NAPROSYN) 500 MG tablet Take 1 tablet (500 mg total) by mouth 2 (two) times daily. Patient not taking: Reported on 02/18/2015 10/06/14   Pauline Aus, PA-C  oseltamivir (TAMIFLU) 75 MG capsule Take 1 capsule (75 mg total) by mouth 2 (two) times daily. For 5 days 02/18/15   Pauline Aus, PA-C  predniSONE (DELTASONE) 10 MG tablet 6 day step down dose Patient not taking: Reported on 02/18/2015 07/04/14   Teressa Lower, NP      Allergies    Patient has no known allergies.    Review of Systems    Review of Systems  HENT:  Positive for congestion.   Respiratory:  Positive for cough, chest tightness and shortness of breath.   All other systems reviewed and are negative.   Physical Exam Updated Vital Signs BP (!) 162/105   Pulse 77   Temp 98.2 F (36.8 C)   Resp (!) 21   Ht 6\' 4"  (1.93 m)   Wt 102.1 kg   SpO2 99%   BMI 27.39 kg/m  Physical Exam Vitals and nursing note reviewed.  Constitutional:      General: He is not in acute distress.    Appearance: He is well-developed. He is not ill-appearing, toxic-appearing or diaphoretic.  HENT:     Head: Normocephalic and atraumatic.     Mouth/Throat:     Mouth: Mucous membranes are moist.  Eyes:     Extraocular Movements: Extraocular movements intact.     Conjunctiva/sclera: Conjunctivae normal.  Cardiovascular:     Rate and Rhythm: Normal rate and regular rhythm.     Heart sounds: No murmur heard. Pulmonary:     Effort: Pulmonary effort is normal. No tachypnea, accessory muscle usage or respiratory distress.     Breath sounds: Normal breath sounds. No decreased breath sounds, wheezing, rhonchi or rales.  Abdominal:     Palpations: Abdomen is soft.     Tenderness: There is no abdominal tenderness.  Musculoskeletal:        General: No  swelling. Normal range of motion.     Cervical back: Normal range of motion and neck supple.     Right lower leg: No edema.     Left lower leg: No edema.  Skin:    General: Skin is warm and dry.     Coloration: Skin is not cyanotic or pale.  Neurological:     General: No focal deficit present.     Mental Status: He is alert and oriented to person, place, and time.  Psychiatric:        Mood and Affect: Mood normal.        Behavior: Behavior normal.     ED Results / Procedures / Treatments   Labs (all labs ordered are listed, but only abnormal results are displayed) Labs Reviewed  BASIC METABOLIC PANEL - Abnormal; Notable for the following components:      Result Value    Glucose, Bld 129 (*)    All other components within normal limits  RESP PANEL BY RT-PCR (RSV, FLU A&B, COVID)  RVPGX2  BRAIN NATRIURETIC PEPTIDE  MAGNESIUM  HEPATIC FUNCTION PANEL  CBC WITH DIFFERENTIAL/PLATELET  TROPONIN I (HIGH SENSITIVITY)  TROPONIN I (HIGH SENSITIVITY)    EKG EKG Interpretation Date/Time:  Tuesday November 17 2023 03:50:46 EST Ventricular Rate:  94 PR Interval:  167 QRS Duration:  90 QT Interval:  348 QTC Calculation: 436 R Axis:   59  Text Interpretation: Sinus rhythm Probable left atrial enlargement RSR' in V1 or V2, probably normal variant Probable left ventricular hypertrophy Confirmed by Gloris Manchester ([id number]) on 11/17/2023 4:08:40 AM  Radiology DG Chest Port 1 View  Result Date: 11/17/2023 CLINICAL DATA:  Shortness of breath and elevated blood pressure. EXAM: PORTABLE CHEST 1 VIEW COMPARISON:  02/18/2015 FINDINGS: Artifact from EKG leads. Normal heart size and mediastinal contours. No acute infiltrate or edema. Biapical pleural thickening. No effusion or pneumothorax. No acute osseous findings. IMPRESSION: No active disease. Electronically Signed   By: Tiburcio Pea M.D.   On: 11/17/2023 06:29    Procedures Procedures    Medications Ordered in ED Medications  acetaminophen (TYLENOL) tablet 650 mg (has no administration in time range)  amLODipine (NORVASC) tablet 5 mg (has no administration in time range)  nitroGLYCERIN (NITROGLYN) 2 % ointment 1 inch (1 inch Topical Given 11/17/23 0417)  hydrALAZINE (APRESOLINE) injection 5 mg (5 mg Intravenous Given 11/17/23 0616)  iohexol (OMNIPAQUE) 350 MG/ML injection 100 mL (100 mLs Intravenous Contrast Given 11/17/23 4401)    ED Course/ Medical Decision Making/ A&P                                 Medical Decision Making Amount and/or Complexity of Data Reviewed Labs: ordered. Radiology: ordered.  Risk OTC drugs. Prescription drug management.   This patient presents to the ED for concern of shortness of  breath, this involves an extensive number of treatment options, and is a complaint that carries with it a high risk of complications and morbidity.  The differential diagnosis includes ACS, CHF, pneumonia, PE, hypertensive crisis   Co morbidities that complicate the patient evaluation  HTN, tobacco use   Additional history obtained:  Additional history obtained from N/A External records from outside source obtained and reviewed including EMR   Lab Tests:  I Ordered, and personally interpreted labs.  The pertinent results include: Normal kidney function, normal electrolytes, normal hemoglobin, normal BNP, normal troponin   Imaging Studies ordered:  I ordered imaging studies including chest x-ray, CTA chest I independently visualized and interpreted imaging which showed no acute findings on x-ray, CTA pending at time of signout I agree with the radiologist interpretation   Cardiac Monitoring: / EKG:  The patient was maintained on a cardiac monitor.  I personally viewed and interpreted the cardiac monitored which showed an underlying rhythm of: [place] rhythm   Problem List / ED Course / Critical interventions / Medication management  Patient presents for acute shortness of breath, started this morning.  He did develop cough and chest congestion yesterday.  On arrival in the ED, vital signs are notable for hypertension.  Patient is well-appearing on exam.  Current breathing is unlabored.  He is able to speak in complete sentences.  Lungs are clear on auscultation.  NTG was ordered for hypertension.  Workup was initiated.  In the ED, patient endorsed headache.  This is likely secondary to NTG.  Tylenol was ordered.  Patient had continued elevated blood pressure.  Dose of hydralazine was ordered.  It does not appear that he is on any blood pressure medications currently despite prior diagnosis of HTN.  Initial lab work is reassuring.  CTA of chest was pending at time of signout.  Care of  patient was signed out to oncoming ED provider. I ordered medication including NTG and hydralazine for HTN; Tylenol for headache Reevaluation of the patient after these medicines showed that the patient improved I have reviewed the patients home medicines and have made adjustments as needed   Social Determinants of Health:  Does not have PCP         Final Clinical Impression(s) / ED Diagnoses Final diagnoses:  SOB (shortness of breath)  Hypertension, unspecified type    Rx / DC Orders ED Discharge Orders          Ordered    amLODipine (NORVASC) 5 MG tablet  Daily        11/17/23 0710              Gloris Manchester, MD 11/17/23 [phone]

## 2023-12-10 ENCOUNTER — Ambulatory Visit: Payer: Self-pay | Admitting: Family Medicine

## 2023-12-22 ENCOUNTER — Encounter: Payer: Self-pay | Admitting: Family Medicine

## 2023-12-22 ENCOUNTER — Ambulatory Visit (INDEPENDENT_AMBULATORY_CARE_PROVIDER_SITE_OTHER): Payer: BC Managed Care – PPO | Admitting: Family Medicine

## 2023-12-22 VITALS — BP 148/86 | HR 85 | Ht 76.0 in | Wt 225.0 lb

## 2023-12-22 DIAGNOSIS — Z1211 Encounter for screening for malignant neoplasm of colon: Secondary | ICD-10-CM

## 2023-12-22 DIAGNOSIS — R7301 Impaired fasting glucose: Secondary | ICD-10-CM

## 2023-12-22 DIAGNOSIS — E038 Other specified hypothyroidism: Secondary | ICD-10-CM | POA: Diagnosis not present

## 2023-12-22 DIAGNOSIS — E559 Vitamin D deficiency, unspecified: Secondary | ICD-10-CM

## 2023-12-22 DIAGNOSIS — Z720 Tobacco use: Secondary | ICD-10-CM

## 2023-12-22 DIAGNOSIS — Z114 Encounter for screening for human immunodeficiency virus [HIV]: Secondary | ICD-10-CM | POA: Diagnosis not present

## 2023-12-22 DIAGNOSIS — I10 Essential (primary) hypertension: Secondary | ICD-10-CM | POA: Diagnosis not present

## 2023-12-22 DIAGNOSIS — Z1159 Encounter for screening for other viral diseases: Secondary | ICD-10-CM | POA: Diagnosis not present

## 2023-12-22 DIAGNOSIS — E7849 Other hyperlipidemia: Secondary | ICD-10-CM | POA: Diagnosis not present

## 2023-12-22 MED ORDER — AMLODIPINE-OLMESARTAN 5-20 MG PO TABS: 1.0000 | ORAL_TABLET | Freq: Every day | ORAL | 1 refills | Status: DC

## 2023-12-22 NOTE — Progress Notes (Signed)
 Established Patient Office Visit  Subjective:  Patient ID: Charles Wong, male    DOB: [birth year]  Age: 48 y.o. MRN: [id number]  CC:  Chief Complaint  Patient presents with   Establish Care    New patient establishing care. Would like to discuss elevated bp.     HPI Charles Wong is a 48 y.o. male with past medical history of hypertension presents for f/u of  chronic medical conditions. For the details of today's visit, please refer to the assessment and plan.   Past Medical History:  Diagnosis Date   Hypertension     History reviewed. No pertinent surgical history.  Family History  Problem Relation Age of Onset   Hypertension Mother     Social History   Socioeconomic History   Marital status: Single    Spouse name: Not on file   Number of children: Not on file   Years of education: Not on file   Highest education level: Not on file  Occupational History   Not on file  Tobacco Use   Smoking status: Every Day    Current packs/day: 0.50    Types: Cigarettes   Smokeless tobacco: Not on file  Substance and Sexual Activity   Alcohol use: No   Drug use: No   Sexual activity: Yes    Birth control/protection: None  Other Topics Concern   Not on file  Social History Narrative   Not on file   Social Drivers of Health   Financial Resource Strain: Not on file  Food Insecurity: Not on file  Transportation Needs: Not on file  Physical Activity: Not on file  Stress: Not on file  Social Connections: Not on file  Intimate Partner Violence: Not on file    Outpatient Medications Prior to Visit  Medication Sig Dispense Refill   amLODipine  (NORVASC ) 5 MG tablet Take 1 tablet (5 mg total) by mouth daily. 30 tablet 2   chlorpheniramine-HYDROcodone  (TUSSIONEX PENNKINETIC ER) 10-8 MG/5ML LQCR Take 5 mLs by mouth 2 (two) times daily. 100 mL 0   HYDROcodone -acetaminophen  (NORCO/VICODIN) 5-325 MG per tablet Take one-two tabs po q 4-6 hrs prn pain 12 tablet 0    ibuprofen  (ADVIL ,MOTRIN ) 800 MG tablet Take 1 tablet (800 mg total) by mouth 3 (three) times daily. 21 tablet 0   naproxen  (NAPROSYN ) 500 MG tablet Take 1 tablet (500 mg total) by mouth 2 (two) times daily. 20 tablet 0   oseltamivir  (TAMIFLU ) 75 MG capsule Take 1 capsule (75 mg total) by mouth 2 (two) times daily. For 5 days 10 capsule 0   predniSONE  (DELTASONE ) 10 MG tablet 6 day step down dose 21 tablet 0   No facility-administered medications prior to visit.    Allergies  Allergen Reactions   Hepatitis A Vaccine Swelling   Hepatitis B Vaccine Swelling    ROS Review of Systems  Constitutional:  Negative for fatigue and fever.  Eyes:  Negative for visual disturbance.  Respiratory:  Negative for chest tightness and shortness of breath.   Cardiovascular:  Negative for chest pain and palpitations.  Neurological:  Negative for dizziness and headaches.      Objective:    Physical Exam HENT:     Head: Normocephalic.     Right Ear: External ear normal.     Left Ear: External ear normal.     Nose: No congestion or rhinorrhea.     Mouth/Throat:     Mouth: Mucous membranes are moist.  Cardiovascular:  Rate and Rhythm: Regular rhythm.     Heart sounds: No murmur heard. Pulmonary:     Effort: No respiratory distress.     Breath sounds: Normal breath sounds.  Neurological:     Mental Status: He is alert.     BP (!) 148/86 (BP Location: Left Arm)   Pulse 85   Ht 6' 4 (1.93 m)   Wt 225 lb 0.6 oz (102.1 kg)   SpO2 97%   BMI 27.39 kg/m  Wt Readings from Last 3 Encounters:  12/22/23 225 lb 0.6 oz (102.1 kg)  11/17/23 225 lb (102.1 kg)  02/03/22 228 lb (103.4 kg)    No results found for: TSH Lab Results  Component Value Date   WBC 6.0 11/17/2023   HGB 14.9 11/17/2023   HCT 43.4 11/17/2023   MCV 92.1 11/17/2023   PLT 176 11/17/2023   Lab Results  Component Value Date   NA 140 11/17/2023   K 3.7 11/17/2023   CO2 26 11/17/2023   GLUCOSE 129 (H) 11/17/2023    BUN 9 11/17/2023   CREATININE 1.16 11/17/2023   BILITOT 0.5 11/17/2023   ALKPHOS 75 11/17/2023   AST 21 11/17/2023   ALT 28 11/17/2023   PROT 8.0 11/17/2023   ALBUMIN 4.3 11/17/2023   CALCIUM  9.8 11/17/2023   ANIONGAP 10 11/17/2023   No results found for: CHOL No results found for: HDL No results found for: LDLCALC No results found for: TRIG No results found for: CHOLHDL No results found for: YHAJ8R    Assessment & Plan:  Primary hypertension Assessment & Plan: The patient presents with uncontrolled blood pressure in the clinic and reports being asymptomatic. His blood pressure readings are consistently in the 150s systolic and 90s diastolic. He is currently taking Lisinopril 5 mg for pain management.  I will initiate therapy with Olmesartan -Amlodipine  20/5 mg daily to help manage his blood pressure. In addition, I recommended a low-sodium diet (less than 2300 mg per day) and encouraged increased physical activity with moderate intensity, aiming for 150 minutes weekly.  The patient is advised to continue with these lifestyle modifications to effectively manage his blood pressure. He is also encouraged to report to the emergency department if his blood pressure exceeds 180/120 and is accompanied by symptoms such as headaches, chest pain, palpitations, blurred vision, or dizziness.  BP Readings from Last 3 Encounters:  12/22/23 (!) 148/86  11/17/23 (!) 165/107  02/03/22 128/82     Orders: -     amLODIPine -Olmesartan ; Take 1 tablet by mouth daily.  Dispense: 30 tablet; Refill: 1  Tobacco use Assessment & Plan: Smokes about 5 cigarettesday  Asked about quitting: confirms that hecurrently smokes cigarettes Advise to quit smoking: Educated about QUITTING to reduce the risk of cancer, cardio and cerebrovascular disease. Assess willingness: Unwilling to quit at this time, but is working on cutting back. Assist with counseling and pharmacotherapy: Counseled for 5  minutes and literature provided. Arrange for follow up: follow up in 3 months and continue to offer help.     Colon cancer screening -     Cologuard  IFG (impaired fasting glucose) -     Hemoglobin A1c  Vitamin D  deficiency -     VITAMIN D  25 Hydroxy (Vit-D Deficiency, Fractures)  Need for hepatitis C screening test -     Hepatitis C antibody  Encounter for screening for HIV -     HIV Antibody (routine testing w rflx)  TSH (thyroid-stimulating hormone deficiency) -  TSH + free T4  Other hyperlipidemia -     Lipid panel -     CMP14+EGFR -     CBC with Differential/Platelet   Note: This chart has been completed using [employer] (consulting) software, and while attempts have been made to ensure accuracy, certain words and phrases may not be transcribed as intended.   Follow-up: Return in about 1 month (around 01/22/2024) for BP.   Odis Wickey, FNP

## 2023-12-22 NOTE — Assessment & Plan Note (Signed)
 The patient presents with uncontrolled blood pressure in the clinic and reports being asymptomatic. His blood pressure readings are consistently in the 150s systolic and 90s diastolic. He is currently taking Lisinopril 5 mg for pain management.  I will initiate therapy with Olmesartan -Amlodipine  20/5 mg daily to help manage his blood pressure. In addition, I recommended a low-sodium diet (less than 2300 mg per day) and encouraged increased physical activity with moderate intensity, aiming for 150 minutes weekly.  The patient is advised to continue with these lifestyle modifications to effectively manage his blood pressure. He is also encouraged to report to the emergency department if his blood pressure exceeds 180/120 and is accompanied by symptoms such as headaches, chest pain, palpitations, blurred vision, or dizziness.  BP Readings from Last 3 Encounters:  12/22/23 (!) 148/86  11/17/23 (!) 165/107  02/03/22 128/82

## 2023-12-22 NOTE — Assessment & Plan Note (Signed)
 Smokes about 5 cigarettesday  Asked about quitting: confirms that hecurrently smokes cigarettes Advise to quit smoking: Educated about QUITTING to reduce the risk of cancer, cardio and cerebrovascular disease. Assess willingness: Unwilling to quit at this time, but is working on cutting back. Assist with counseling and pharmacotherapy: Counseled for 5 minutes and literature provided. Arrange for follow up: follow up in 3 months and continue to offer help.

## 2023-12-22 NOTE — Patient Instructions (Addendum)
 I appreciate the opportunity to provide care to you today!    Follow up:  1 months  Labs: please stop by the lab today to get your blood drawn (CBC, CMP, TSH, Lipid profile, HgA1c, Vit D)  Screening: HIV and Hep C  Hypertension Management  Your current blood pressure is above the target goal of <140/90 mmHg. To address this, please continue taking amlodipine -olmesartan  5-20 mg daily  Medication Instructions: Take your blood pressure medication at the same time each day. After taking your medication, check your blood pressure at least an hour later. If your first reading is >140/90 mmHg, wait at least 10 minutes and recheck your blood pressure. Side Effects: In the initial days of therapy, you may experience dizziness or lightheadedness as your body adjusts to the lower blood pressure; this is expected. Diet and Lifestyle: Adhere to a low-sodium diet, limiting intake to less than 1500 mg daily, and increase your physical activity. Avoid over-the-counter NSAIDs such as ibuprofen  and naproxen  while on this medication. Hydration and Nutrition: Stay well-hydrated by drinking at least 64 ounces of water daily. Increase your servings of fruits and vegetables and avoid excessive sodium in your diet. Long-Term Considerations: Uncontrolled hypertension can increase the risk of cardiovascular diseases, including stroke, coronary artery disease, and heart failure.  Please report to the emergency department if your blood pressure exceeds 180/120 and is accompanied by symptoms such as headaches, chest pain, palpitations, blurred vision, or dizziness.     Attached with your AVS, you will find valuable resources for self-education. I highly recommend dedicating some time to thoroughly examine them.   Please continue to a heart-healthy diet and increase your physical activities. Try to exercise for at least five days a week.    It was a pleasure to see you and I look forward to continuing to work  together on your health and well-being. Please do not hesitate to call the office if you need care or have questions about your care.  In case of emergency, please visit the Emergency Department for urgent care, or contact our clinic at [phone] to schedule an appointment. We're here to help you!   Have a wonderful day and week. With Gratitude, Taraji Mungo MSN, FNP-BC

## 2023-12-24 LAB — HEMOGLOBIN A1C
Est. average glucose Bld gHb Est-mCnc: 108 mg/dL
Hgb A1c MFr Bld: 5.4 % (ref 4.8–5.6)

## 2023-12-24 LAB — CMP14+EGFR
ALT: 13 [IU]/L (ref 0–44)
AST: 12 [IU]/L (ref 0–40)
Albumin: 4.8 g/dL (ref 4.1–5.1)
Alkaline Phosphatase: 112 [IU]/L (ref 44–121)
BUN/Creatinine Ratio: 8 — ABNORMAL LOW (ref 9–20)
BUN: 9 mg/dL (ref 6–24)
Bilirubin Total: 0.3 mg/dL (ref 0.0–1.2)
CO2: 23 mmol/L (ref 20–29)
Calcium: 9.4 mg/dL (ref 8.7–10.2)
Chloride: 100 mmol/L (ref 96–106)
Creatinine, Ser: 1.12 mg/dL (ref 0.76–1.27)
Globulin, Total: 2.6 g/dL (ref 1.5–4.5)
Glucose: 86 mg/dL (ref 70–99)
Potassium: 4.2 mmol/L (ref 3.5–5.2)
Sodium: 140 mmol/L (ref 134–144)
Total Protein: 7.4 g/dL (ref 6.0–8.5)
eGFR: 81 mL/min/{1.73_m2} (ref >59)

## 2023-12-24 LAB — CBC WITH DIFFERENTIAL/PLATELET
Basophils Absolute: 0 10*3/uL (ref 0.0–0.2)
Basos: 1 %
EOS (ABSOLUTE): 0.4 10*3/uL (ref 0.0–0.4)
Eos: 7 %
Hematocrit: 42.9 % (ref 37.5–51.0)
Hemoglobin: 13.9 g/dL (ref 13.0–17.7)
Immature Grans (Abs): 0 10*3/uL (ref 0.0–0.1)
Immature Granulocytes: 0 %
Lymphocytes Absolute: 2.5 10*3/uL (ref 0.7–3.1)
Lymphs: 46 %
MCH: 30.9 pg (ref 26.6–33.0)
MCHC: 32.4 g/dL (ref 31.5–35.7)
MCV: 95 fL (ref 79–97)
Monocytes Absolute: 0.6 10*3/uL (ref 0.1–0.9)
Monocytes: 12 %
Neutrophils Absolute: 1.8 10*3/uL (ref 1.4–7.0)
Neutrophils: 34 %
Platelets: 209 10*3/uL (ref 150–450)
RBC: 4.5 x10E6/uL (ref 4.14–5.80)
RDW: 11.8 % (ref 11.6–15.4)
WBC: 5.4 10*3/uL (ref 3.4–10.8)

## 2023-12-24 LAB — TSH+FREE T4
Free T4: 1.37 ng/dL (ref 0.82–1.77)
TSH: 0.385 u[IU]/mL — ABNORMAL LOW (ref 0.450–4.500)

## 2023-12-24 LAB — LIPID PANEL
Chol/HDL Ratio: 6.8 {ratio} — ABNORMAL HIGH (ref 0.0–5.0)
Cholesterol, Total: 223 mg/dL — ABNORMAL HIGH (ref 100–199)
HDL: 33 mg/dL — ABNORMAL LOW (ref >39)
LDL Chol Calc (NIH): 158 mg/dL — ABNORMAL HIGH (ref 0–99)
Triglycerides: 174 mg/dL — ABNORMAL HIGH (ref 0–149)
VLDL Cholesterol Cal: 32 mg/dL (ref 5–40)

## 2023-12-24 LAB — VITAMIN D 25 HYDROXY (VIT D DEFICIENCY, FRACTURES): Vit D, 25-Hydroxy: 10.1 ng/mL — ABNORMAL LOW (ref 30.0–100.0)

## 2023-12-24 LAB — HIV ANTIBODY (ROUTINE TESTING W REFLEX)

## 2023-12-25 ENCOUNTER — Other Ambulatory Visit: Payer: Self-pay | Admitting: Family Medicine

## 2023-12-25 DIAGNOSIS — E7849 Other hyperlipidemia: Secondary | ICD-10-CM

## 2023-12-25 DIAGNOSIS — E559 Vitamin D deficiency, unspecified: Secondary | ICD-10-CM

## 2023-12-25 MED ORDER — VITAMIN D (ERGOCALCIFEROL) 1.25 MG (50000 UNIT) PO CAPS: 50000.0000 [IU] | ORAL_CAPSULE | ORAL | 1 refills | Status: AC

## 2023-12-25 MED ORDER — ROSUVASTATIN CALCIUM 10 MG PO TABS: 10.0000 mg | ORAL_TABLET | Freq: Every day | ORAL | 3 refills | Status: AC

## 2023-12-25 NOTE — Progress Notes (Signed)
 Please inform the patient that a prescription for a weekly vitamin D  supplement has been sent to his pharmacy due to low vitamin D  levels.  His cholesterol levels are elevated, and I have started him on rosuvastatin  10 mg daily; the prescription has been sent to his pharmacy.  I recommend reducing his intake of greasy, fatty, and starchy foods to help lower his cholesterol levels.  All other lab results are stable.

## 2024-02-02 ENCOUNTER — Ambulatory Visit: Payer: BC Managed Care – PPO | Admitting: Family Medicine

## 2024-02-24 ENCOUNTER — Emergency Department (HOSPITAL_COMMUNITY)
Admission: EM | Admit: 2024-02-24 | Discharge: 2024-02-24 | Disposition: A | Discharge status: Home / Self Care | Attending: Emergency Medicine | Admitting: Emergency Medicine

## 2024-02-24 ENCOUNTER — Other Ambulatory Visit: Payer: Self-pay

## 2024-02-24 ENCOUNTER — Encounter (HOSPITAL_COMMUNITY): Payer: Self-pay | Admitting: *Deleted

## 2024-02-24 ENCOUNTER — Emergency Department (HOSPITAL_COMMUNITY)

## 2024-02-24 DIAGNOSIS — R739 Hyperglycemia, unspecified: Secondary | ICD-10-CM | POA: Insufficient documentation

## 2024-02-24 DIAGNOSIS — R1032 Left lower quadrant pain: Secondary | ICD-10-CM | POA: Diagnosis not present

## 2024-02-24 DIAGNOSIS — R109 Unspecified abdominal pain: Secondary | ICD-10-CM | POA: Insufficient documentation

## 2024-02-24 DIAGNOSIS — Z79899 Other long term (current) drug therapy: Secondary | ICD-10-CM | POA: Insufficient documentation

## 2024-02-24 DIAGNOSIS — M545 Low back pain, unspecified: Secondary | ICD-10-CM

## 2024-02-24 DIAGNOSIS — I10 Essential (primary) hypertension: Secondary | ICD-10-CM | POA: Diagnosis not present

## 2024-02-24 LAB — URINALYSIS, ROUTINE W REFLEX MICROSCOPIC
Bacteria, UA: NONE SEEN
Bilirubin Urine: NEGATIVE
Glucose, UA: 50 mg/dL — AB
Hgb urine dipstick: NEGATIVE
Ketones, ur: NEGATIVE mg/dL
Leukocytes,Ua: NEGATIVE
Nitrite: NEGATIVE
Protein, ur: 30 mg/dL — AB
Specific Gravity, Urine: 1.016 (ref 1.005–1.030)
pH: 6 (ref 5.0–8.0)

## 2024-02-24 LAB — LIPASE, BLOOD: Lipase: 24 U/L (ref 11–51)

## 2024-02-24 LAB — CBC
HCT: 44.3 % (ref 39.0–52.0)
Hemoglobin: 14.6 g/dL (ref 13.0–17.0)
MCH: 31 pg (ref 26.0–34.0)
MCHC: 33 g/dL (ref 30.0–36.0)
MCV: 94.1 fL (ref 80.0–100.0)
Platelets: 175 10*3/uL (ref 150–400)
RBC: 4.71 MIL/uL (ref 4.22–5.81)
RDW: 12.2 % (ref 11.5–15.5)
WBC: 8.8 10*3/uL (ref 4.0–10.5)
nRBC: 0 % (ref 0.0–0.2)

## 2024-02-24 LAB — COMPREHENSIVE METABOLIC PANEL
ALT: 18 U/L (ref 0–44)
AST: 16 U/L (ref 15–41)
Albumin: 4.4 g/dL (ref 3.5–5.0)
Alkaline Phosphatase: 79 U/L (ref 38–126)
Anion gap: 7 (ref 5–15)
BUN: 15 mg/dL (ref 6–20)
CO2: 26 mmol/L (ref 22–32)
Calcium: 9.3 mg/dL (ref 8.9–10.3)
Chloride: 102 mmol/L (ref 98–111)
Creatinine, Ser: 1.16 mg/dL (ref 0.61–1.24)
GFR, Estimated: >60 mL/min (ref >60)
Glucose, Bld: 191 mg/dL — ABNORMAL HIGH (ref 70–99)
Potassium: 4 mmol/L (ref 3.5–5.1)
Sodium: 135 mmol/L (ref 135–145)
Total Bilirubin: 0.7 mg/dL (ref 0.0–1.2)
Total Protein: 8.1 g/dL (ref 6.5–8.1)

## 2024-02-24 MED ORDER — FENTANYL CITRATE (PF) 100 MCG/2ML IJ SOLN
100.0000 ug | Freq: Once | INTRAMUSCULAR | Status: AC
  Administered 2024-02-24: 100 ug via INTRAVENOUS
  Filled 2024-02-24: qty 2

## 2024-02-24 MED ORDER — DIAZEPAM 2 MG PO TABS
2.0000 mg | ORAL_TABLET | Freq: Once | ORAL | Status: AC
  Administered 2024-02-24: 2 mg via ORAL
  Filled 2024-02-24: qty 1

## 2024-02-24 MED ORDER — NAPROXEN 375 MG PO TABS: 375.0000 mg | ORAL_TABLET | Freq: Two times a day (BID) | ORAL | 0 refills | Status: AC

## 2024-02-24 MED ORDER — LIDOCAINE 5 % EX PTCH
1.0000 | MEDICATED_PATCH | CUTANEOUS | Status: DC
  Administered 2024-02-24: 1 via TRANSDERMAL
  Filled 2024-02-24: qty 1

## 2024-02-24 MED ORDER — KETOROLAC TROMETHAMINE 15 MG/ML IJ SOLN
15.0000 mg | Freq: Once | INTRAMUSCULAR | Status: AC
  Administered 2024-02-24: 15 mg via INTRAVENOUS
  Filled 2024-02-24: qty 1

## 2024-02-24 MED ORDER — LIDOCAINE 5 % EX PTCH: 1.0000 | MEDICATED_PATCH | CUTANEOUS | 0 refills | Status: AC

## 2024-02-24 MED ORDER — SODIUM CHLORIDE 0.9 % IV BOLUS
1000.0000 mL | Freq: Once | INTRAVENOUS | Status: AC
  Administered 2024-02-24: 1000 mL via INTRAVENOUS

## 2024-02-24 MED ORDER — ONDANSETRON HCL 4 MG/2ML IJ SOLN
4.0000 mg | Freq: Once | INTRAMUSCULAR | Status: AC
  Administered 2024-02-24: 4 mg via INTRAVENOUS
  Filled 2024-02-24: qty 2

## 2024-02-24 NOTE — ED Provider Notes (Signed)
 [place] EMERGENCY DEPARTMENT AT [facility] Provider Note   CSN: [id number] Arrival date & time: 02/24/24  4098     History  Chief Complaint  Patient presents with   Abdominal Pain    Charles Wong is a 48 y.o. male.  The history is provided by the patient and a significant other.  Abdominal Pain Pain location:  L flank Pain severity:  Severe Onset quality:  Gradual Duration:  2 days Timing:  Intermittent Progression:  Worsening Relieved by:  Nothing Associated symptoms: nausea and vomiting   Associated symptoms: no chest pain and no diarrhea   Risk factors: has not had multiple surgeries   Patient reports left flank pain but denies any urinary symptoms.  He has never had this before. No previous surgeries     Home Medications Prior to Admission medications   Medication Sig Start Date End Date Taking? Authorizing Provider  amLODipine-olmesartan (AZOR) 5-20 MG tablet Take 1 tablet by mouth daily. 12/22/23   Gilmore Laroche, FNP  rosuvastatin (CRESTOR) 10 MG tablet Take 1 tablet (10 mg total) by mouth daily. 12/25/23   Gilmore Laroche, FNP  Vitamin D, Ergocalciferol, (DRISDOL) 1.25 MG (50000 UNIT) CAPS capsule Take 1 capsule (50,000 Units total) by mouth every 7 (seven) days. 12/25/23   Gilmore Laroche, FNP      Allergies    Hepatitis a vaccine and Hepatitis b vaccine    Review of Systems   Review of Systems  Cardiovascular:  Negative for chest pain.  Gastrointestinal:  Positive for abdominal pain, nausea and vomiting. Negative for diarrhea.  Genitourinary:  Positive for flank pain.  Musculoskeletal:  Positive for back pain.  Neurological:  Negative for weakness.    Physical Exam Updated Vital Signs BP (!) 184/119 (BP Location: Right Arm)   Pulse 67   Temp 97.8 F (36.6 C) (Oral)   Resp 20   Ht 1.93 m (6\' 4" )   Wt 105.2 kg   SpO2 98%   BMI 28.24 kg/m  Physical Exam CONSTITUTIONAL: Well developed/well nourished, uncomfortable  appearing HEAD: Normocephalic/atraumatic NECK: supple no meningeal signs SPINE/BACK:entire spine nontender CV: S1/S2 noted, no murmurs/rubs/gallops noted LUNGS: Lungs are clear to auscultation bilaterally, no apparent distress ABDOMEN: soft, nontender, no rebound or guarding, bowel sounds noted throughout abdomen JX:BJYN  cva tenderness Lidocaine patch in place on arrival NEURO: Pt is awake/alert/appropriate, moves all extremitiesx4.  No facial droop.  He is able to move both legs without difficulty, no focal weakness EXTREMITIES: pulses normal/equal, full ROM SKIN: warm, color normal   ED Results / Procedures / Treatments   Labs (all labs ordered are listed, but only abnormal results are displayed) Labs Reviewed  COMPREHENSIVE METABOLIC PANEL - Abnormal; Notable for the following components:      Result Value   Glucose, Bld 191 (*)    All other components within normal limits  URINALYSIS, ROUTINE W REFLEX MICROSCOPIC - Abnormal; Notable for the following components:   Glucose, UA 50 (*)    Protein, ur 30 (*)    All other components within normal limits  LIPASE, BLOOD  CBC    EKG EKG Interpretation Date/Time:  Wednesday February 24 2024 06:14:58 EDT Ventricular Rate:  69 PR Interval:  183 QRS Duration:  90 QT Interval:  396 QTC Calculation: 425 R Axis:   77  Text Interpretation: Sinus rhythm Biatrial enlargement RSR' in V1 or V2, probably normal variant Left ventricular hypertrophy Confirmed by Zadie Rhine ([id number]) on 02/24/2024 6:21:18 AM  Radiology No  results found.  Procedures Procedures    Medications Ordered in ED Medications  ondansetron (ZOFRAN) injection 4 mg (4 mg Intravenous Given 02/24/24 0605)  ketorolac (TORADOL) 15 MG/ML injection 15 mg (15 mg Intravenous Given 02/24/24 0605)  sodium chloride 0.9 % bolus 1,000 mL (1,000 mLs Intravenous New Bag/Given 02/24/24 0606)  fentaNYL (SUBLIMAZE) injection 100 mcg (100 mcg Intravenous Given 02/24/24 4010)    ED  Course/ Medical Decision Making/ A&P Clinical Course as of 02/24/24 0702  Wed Feb 24, 2024  0619 Glucose(!): 191 Mild hyperglycemia [DW]  2725 On initial exam, patient was very uncomfortable and difficult to obtain history On reassessment, he now reports that he bent over 2 days ago and had immediate pain in his low back.  No leg weakness is reported He was able to manage the pain, but became acutely worse tonight with lower abdominal pain.  On exam he  appears uncomfortable, but he is able to fully move both legs without difficulty [DW]  0628 Given his worsening pain, as well as some reported abdominal pain, will obtain CT imaging  Patient denies any chest pain  [DW]  0702 At signout to dr Posey Rea, f/u on CT imaging and reasess [DW]    Clinical Course User Index [DW] Zadie Rhine, MD                                 Medical Decision Making Amount and/or Complexity of Data Reviewed Labs: ordered. Decision-making details documented in ED Course. Radiology: ordered. ECG/medicine tests: ordered.  Risk Prescription drug management.   This patient presents to the ED for concern of flank pain, this involves an extensive number of treatment options, and is a complaint that carries with it a high risk of complications and morbidity.  The differential diagnosis includes but is not limited to pyelonephritis, ureteral stone, muscle strain, epidural abscess, discitis, abscess   Comorbidities that complicate the patient evaluation: Patient's presentation is complicated by their history of hypertension  Social Determinants of Health: Patient's  tobacco use   increases the complexity of managing their presentation  Additional history obtained: Additional history obtained from significant other Records reviewed Primary Care Documents  Lab Tests: I Ordered, and personally interpreted labs.  The pertinent results include:  mild hyperglycemia  Imaging Studies ordered: I ordered imaging  studies including CT scan renal    Medicines ordered and prescription drug management: I ordered medication including Toradol for pain Reevaluation of the patient after these medicines showed that the patient    stayed the same   Reevaluation: After the interventions noted above, I reevaluated the patient and found that they have :stayed the same  Complexity of problems addressed: Patient's presentation is most consistent with  acute presentation with potential threat to life or bodily function          Final Clinical Impression(s) / ED Diagnoses Final diagnoses:  None    Rx / DC Orders ED Discharge Orders     None         Zadie Rhine, MD 02/24/24 [id number]

## 2024-02-24 NOTE — ED Triage Notes (Signed)
 Pt c/o left flank pain that started 2 days ago and tonight he woke up with lower left abdominal pain  Pt denies any urinary sx  Pt has had an episode of vomiting while waiting

## 2024-02-24 NOTE — ED Provider Notes (Addendum)
  Physical Exam  BP (!) 161/94   Pulse 72   Temp 97.9 F (36.6 C) (Oral)   Resp 15   Ht 6\' 4"  (1.93 m)   Wt 105.2 kg   SpO2 98%   BMI 28.24 kg/m   Physical Exam Constitutional:      General: He is not in acute distress.    Appearance: Normal appearance.  HENT:     Head: Normocephalic and atraumatic.     Nose: No congestion or rhinorrhea.  Eyes:     General:        Right eye: No discharge.        Left eye: No discharge.     Extraocular Movements: Extraocular movements intact.     Pupils: Pupils are equal, round, and reactive to light.  Cardiovascular:     Rate and Rhythm: Normal rate and regular rhythm.     Heart sounds: No murmur heard. Pulmonary:     Effort: No respiratory distress.     Breath sounds: No wheezing or rales.  Abdominal:     General: There is no distension.     Tenderness: There is no abdominal tenderness.  Musculoskeletal:        General: Tenderness present. Normal range of motion.     Cervical back: Normal range of motion.  Skin:    General: Skin is warm and dry.  Neurological:     General: No focal deficit present.     Mental Status: He is alert.     Procedures  Procedures  ED Course / MDM   Clinical Course as of 02/24/24 0938  Wed Feb 24, 2024  0619 Glucose(!): 191 Mild hyperglycemia [DW]  0628 On initial exam, patient was very uncomfortable and difficult to obtain history On reassessment, he now reports that he bent over 2 days ago and had immediate pain in his low back.  No leg weakness is reported He was able to manage the pain, but became acutely worse tonight with lower abdominal pain.  On exam he  appears uncomfortable, but he is able to fully move both legs without difficulty [DW]  0628 Given his worsening pain, as well as some reported abdominal pain, will obtain CT imaging  Patient denies any chest pain  [DW]  0702 At signout to dr Posey Rea, f/u on CT imaging and reasess [DW]    Clinical Course User Index [DW] Zadie Rhine, MD   Medical Decision Making Amount and/or Complexity of Data Reviewed Labs: ordered. Decision-making details documented in ED Course. Radiology: ordered. ECG/medicine tests: ordered.  Risk Prescription drug management.   Patient received in handoff.  Left flank pain pending CT stone study.  Currently denying any abdominal pain.  Stone study reassuringly negative for stone.  Symptoms improved with Lidoderm patch and small dose of Valium.  Likely lumbosacral strain.  States that symptoms are improved after medication and is able to ambulate without difficulty.  Currently does not meet inpatient criteria for admission will be discharged with outpatient follow-up.       Glendora Score, MD 02/24/24 0865    Glendora Score, MD 02/24/24 [phone]

## 2024-02-24 NOTE — ED Notes (Signed)
 Pt ambulated around nurse desk without difficulty.

## 2024-03-17 ENCOUNTER — Ambulatory Visit (INDEPENDENT_AMBULATORY_CARE_PROVIDER_SITE_OTHER): Payer: BC Managed Care – PPO | Admitting: Family Medicine

## 2024-03-17 ENCOUNTER — Encounter: Payer: Self-pay | Admitting: Family Medicine

## 2024-03-17 VITALS — BP 142/80 | HR 81 | Resp 16 | Ht 76.0 in | Wt 223.0 lb

## 2024-03-17 DIAGNOSIS — I10 Essential (primary) hypertension: Secondary | ICD-10-CM

## 2024-03-17 DIAGNOSIS — Z720 Tobacco use: Secondary | ICD-10-CM

## 2024-03-17 MED ORDER — AMLODIPINE-OLMESARTAN 5-20 MG PO TABS: 1.0000 | ORAL_TABLET | Freq: Every day | ORAL | 1 refills | Status: AC

## 2024-03-17 NOTE — Assessment & Plan Note (Addendum)
 Uncontrolled Blood Pressure (In-Clinic): The patient's blood pressure was elevated during today's visit; however, he reports that his ambulatory readings are usually well controlled, with systolic readings <140 mmHg and diastolic <80 mmHg. He declines changes to his current blood pressure medication at this time. He complains of blurry vision and difficulty seeing large letters at a distance. His last eye exam was in 2020. He denies eye pain, redness, headaches, or dizziness. The patient did express increased stress related to leaving work, which may be contributing to his elevated blood pressure in the clinic today. He is encouraged to continue his current treatment regimen of Olmesartan-Amlodipine 20-5 mg daily. He was advised to report to the emergency department if his blood pressure exceeds 180/120 mmHg, especially if accompanied by headaches, dizziness, blurred vision, or chest pain. In-clinic Snellen eye exam showed 20/25 vision. The patient was encouraged to get a comprehensive eye exam at the [facility], and he consented to the recommendation.

## 2024-03-17 NOTE — Progress Notes (Signed)
 Established Patient Office Visit  Subjective:  Patient ID: Charles Wong, male    DOB: [birth year]  Age: 48 y.o. MRN: [id number]  CC:  Chief Complaint  Patient presents with   Hypertension    1 month follow up   Referral    Needs referral for eye exam, in [place]     HPI Charles Wong is a 48 y.o. male with past medical history of  Hypertension presents for f/u. For the details of today's visit, please refer to the assessment and plan.   Past Medical History:  Diagnosis Date   Hypertension     No past surgical history on file.  Family History  Problem Relation Age of Onset   Hypertension Mother     Social History   Socioeconomic History   Marital status: Single    Spouse name: Not on file   Number of children: Not on file   Years of education: Not on file   Highest education level: Not on file  Occupational History   Not on file  Tobacco Use   Smoking status: Every Day    Current packs/day: 0.50    Types: Cigarettes   Smokeless tobacco: Not on file  Vaping Use   Vaping status: Never Used  Substance and Sexual Activity   Alcohol use: No   Drug use: No   Sexual activity: Yes    Birth control/protection: None  Other Topics Concern   Not on file  Social History Narrative   Not on file   Social Drivers of Health   Financial Resource Strain: Not on file  Food Insecurity: Not on file  Transportation Needs: Not on file  Physical Activity: Not on file  Stress: Not on file  Social Connections: Not on file  Intimate Partner Violence: Not on file    Outpatient Medications Prior to Visit  Medication Sig Dispense Refill   naproxen (NAPROSYN) 375 MG tablet Take 1 tablet (375 mg total) by mouth 2 (two) times daily. 20 tablet 0   amLODipine-olmesartan (AZOR) 5-20 MG tablet Take 1 tablet by mouth daily. 30 tablet 1   lidocaine (LIDODERM) 5 % Place 1 patch onto the skin daily. Remove & Discard patch within 12 hours or as directed by MD (Patient not  taking: Reported on 03/17/2024) 30 patch 0   rosuvastatin (CRESTOR) 10 MG tablet Take 1 tablet (10 mg total) by mouth daily. (Patient not taking: Reported on 03/17/2024) 90 tablet 3   Vitamin D, Ergocalciferol, (DRISDOL) 1.25 MG (50000 UNIT) CAPS capsule Take 1 capsule (50,000 Units total) by mouth every 7 (seven) days. (Patient not taking: Reported on 03/17/2024) 20 capsule 1   No facility-administered medications prior to visit.    Allergies  Allergen Reactions   Hepatitis A Vaccine Swelling   Hepatitis B Vaccine Swelling    ROS Review of Systems  Constitutional:  Negative for fatigue and fever.  Eyes:  Positive for visual disturbance.  Respiratory:  Negative for chest tightness and shortness of breath.   Cardiovascular:  Negative for chest pain and palpitations.  Neurological:  Negative for dizziness and headaches.      Objective:    Physical Exam HENT:     Head: Normocephalic.     Right Ear: External ear normal.     Left Ear: External ear normal.     Nose: No congestion or rhinorrhea.     Mouth/Throat:     Mouth: Mucous membranes are moist.  Cardiovascular:     Rate  and Rhythm: Regular rhythm.     Heart sounds: No murmur heard. Pulmonary:     Effort: No respiratory distress.     Breath sounds: Normal breath sounds.  Neurological:     Mental Status: He is alert.     BP (!) 142/80   Pulse 81   Resp 16   Ht 6\' 4"  (1.93 m)   Wt 223 lb (101.2 kg)   SpO2 97%   BMI 27.14 kg/m  Wt Readings from Last 3 Encounters:  03/17/24 223 lb (101.2 kg)  02/24/24 232 lb (105.2 kg)  12/22/23 225 lb 0.6 oz (102.1 kg)    Lab Results  Component Value Date   TSH 0.385 (L) 12/22/2023   Lab Results  Component Value Date   WBC 8.8 02/24/2024   HGB 14.6 02/24/2024   HCT 44.3 02/24/2024   MCV 94.1 02/24/2024   PLT 175 02/24/2024   Lab Results  Component Value Date   NA 135 02/24/2024   K 4.0 02/24/2024   CO2 26 02/24/2024   GLUCOSE 191 (H) 02/24/2024   BUN 15 02/24/2024    CREATININE 1.16 02/24/2024   BILITOT 0.7 02/24/2024   ALKPHOS 79 02/24/2024   AST 16 02/24/2024   ALT 18 02/24/2024   PROT 8.1 02/24/2024   ALBUMIN 4.4 02/24/2024   CALCIUM 9.3 02/24/2024   ANIONGAP 7 02/24/2024   EGFR 81 12/22/2023   Lab Results  Component Value Date   CHOL 223 (H) 12/22/2023   Lab Results  Component Value Date   HDL 33 (L) 12/22/2023   Lab Results  Component Value Date   LDLCALC 158 (H) 12/22/2023   Lab Results  Component Value Date   TRIG 174 (H) 12/22/2023   Lab Results  Component Value Date   CHOLHDL 6.8 (H) 12/22/2023   Lab Results  Component Value Date   HGBA1C 5.4 12/22/2023      Assessment & Plan:  Primary hypertension Assessment & Plan: Uncontrolled Blood Pressure (In-Clinic): The patient's blood pressure was elevated during today's visit; however, he reports that his ambulatory readings are usually well controlled, with systolic readings <140 mmHg and diastolic <80 mmHg. He declines changes to his current blood pressure medication at this time. He complains of blurry vision and difficulty seeing large letters at a distance. His last eye exam was in 2020. He denies eye pain, redness, headaches, or dizziness. The patient did express increased stress related to leaving work, which may be contributing to his elevated blood pressure in the clinic today. He is encouraged to continue his current treatment regimen of Olmesartan-Amlodipine 20-5 mg daily. He was advised to report to the emergency department if his blood pressure exceeds 180/120 mmHg, especially if accompanied by headaches, dizziness, blurred vision, or chest pain. In-clinic [place] eye exam showed 20/25 vision. The patient was encouraged to get a comprehensive eye exam at the [facility], and he consented to the recommendation.   Orders: -     amLODIPine-Olmesartan; Take 1 tablet by mouth daily.  Dispense: 90 tablet; Refill: 1  Tobacco use Assessment & Plan: The patient  reports that one pack of cigarettes usually lasts two days. Smoking cessation was encouraged.    Note: This chart has been completed using Engineer, civil (consulting) software, and while attempts have been made to ensure accuracy, certain words and phrases may not be transcribed as intended.    Follow-up: Return in about 2 months (around 05/17/2024).   Gilmore Laroche, FNP

## 2024-03-17 NOTE — Assessment & Plan Note (Signed)
 The patient reports that one pack of cigarettes usually lasts two days. Smoking cessation was encouraged.

## 2024-03-17 NOTE — Patient Instructions (Addendum)
 I appreciate the opportunity to provide care to you today!    Follow up:  2 month for BP   Hypertension Management  Your current blood pressure is above the target goal of <140/90 mmHg. To address this, please continue taking Olmesartan- amlodipine 20-5 mg daily   Medication Instructions: Take your blood pressure medication at the same time each day. After taking your medication, check your blood pressure at least an hour later. If your first reading is >140/90 mmHg, wait at least 10 minutes and recheck your blood pressure. Side Effects: In the initial days of therapy, you may experience dizziness or lightheadedness as your body adjusts to the lower blood pressure; this is expected. Diet and Lifestyle: Adhere to a low-sodium diet, limiting intake to less than 1500 mg daily, and increase your physical activity. Avoid over-the-counter NSAIDs such as ibuprofen and naproxen while on this medication. Hydration and Nutrition: Stay well-hydrated by drinking at least 64 ounces of water daily. Increase your servings of fruits and vegetables and avoid excessive sodium in your diet. Long-Term Considerations: Uncontrolled hypertension can increase the risk of cardiovascular diseases, including stroke, coronary artery disease, and heart failure.  Please report to the emergency department if your blood pressure exceeds 180/120 and is accompanied by symptoms such as headaches, chest pain, palpitations, blurred vision, or dizziness.   Attached with your AVS, you will find valuable resources for self-education. I highly recommend dedicating some time to thoroughly examine them.   Please continue to a heart-healthy diet and increase your physical activities. Try to exercise for at least five days a week.    It was a pleasure to see you and I look forward to continuing to work together on your health and well-being. Please do not hesitate to call the office if you need care or have questions about your  care.  In case of emergency, please visit the Emergency Department for urgent care, or contact our clinic at [phone] to schedule an appointment. We're here to help you!   Have a wonderful day and week. With Gratitude, Gilmore Laroche MSN, FNP-BC

## 2024-05-19 ENCOUNTER — Telehealth: Payer: Self-pay | Admitting: Family Medicine

## 2024-05-19 ENCOUNTER — Ambulatory Visit: Payer: Self-pay | Admitting: Family Medicine

## 2024-05-19 NOTE — Telephone Encounter (Unsigned)
 Copied from CRM [id number]. Topic: Clinical - Medication Refill >> May 19, 2024  1:13 PM [place] R wrote: Medication: amLODipine -olmesartan  (AZOR ) 5-20 MG tablet [[id number]]  Has the patient contacted their pharmacy? Yes there are no refills avail   This is the patient's preferred pharmacy:  [facility] [address] [place] - [address] [place] [address] Phone: [phone] Fax: [phone]  Is this the correct pharmacy for this prescription? Yes If no, delete pharmacy and type the correct one.   Has the prescription been filled recently? Yes  Is the patient out of the medication? No 3 more pills   Has the patient been seen for an appointment in the last year OR does the patient have an upcoming appointment? Yes  Can we respond through MyChart? Yes  Agent: Please be advised that Rx refills may take up to 3 business days. We ask that you follow-up with your pharmacy.

## 2024-05-25 ENCOUNTER — Encounter: Payer: Self-pay | Admitting: Family Medicine

## 2024-07-07 ENCOUNTER — Ambulatory Visit: Payer: Self-pay | Admitting: Family Medicine
